# Patient Record
Sex: Male | Born: 1940
Health system: Southern US, Community
[De-identification: ages and names within clinical notes are randomized; demographics above are authoritative.]

## PROBLEM LIST (undated history)

## (undated) DIAGNOSIS — F32A Depression, unspecified: Secondary | ICD-10-CM

## (undated) DIAGNOSIS — M1990 Unspecified osteoarthritis, unspecified site: Secondary | ICD-10-CM

## (undated) DIAGNOSIS — N400 Enlarged prostate without lower urinary tract symptoms: Secondary | ICD-10-CM

## (undated) DIAGNOSIS — K649 Unspecified hemorrhoids: Secondary | ICD-10-CM

## (undated) DIAGNOSIS — E785 Hyperlipidemia, unspecified: Secondary | ICD-10-CM

## (undated) DIAGNOSIS — K219 Gastro-esophageal reflux disease without esophagitis: Secondary | ICD-10-CM

## (undated) DIAGNOSIS — F419 Anxiety disorder, unspecified: Secondary | ICD-10-CM

## (undated) DIAGNOSIS — I10 Essential (primary) hypertension: Secondary | ICD-10-CM

## (undated) DIAGNOSIS — G4730 Sleep apnea, unspecified: Secondary | ICD-10-CM

## (undated) DIAGNOSIS — F329 Major depressive disorder, single episode, unspecified: Secondary | ICD-10-CM

## (undated) HISTORY — DX: Benign prostatic hyperplasia without lower urinary tract symptoms: N40.0

## (undated) HISTORY — DX: Hyperlipidemia, unspecified: E78.5

## (undated) HISTORY — DX: Gastro-esophageal reflux disease without esophagitis: K21.9

## (undated) HISTORY — PX: SEPTOPLASTY: SUR1290

## (undated) HISTORY — DX: Depression, unspecified: F32.A

## (undated) HISTORY — PX: TONSILLECTOMY: SUR1361

## (undated) HISTORY — PX: OTHER SURGICAL HISTORY: SHX169

## (undated) HISTORY — PX: BACK SURGERY: SHX140

## (undated) HISTORY — PX: NASAL SINUS SURGERY: SHX719

## (undated) HISTORY — DX: Essential (primary) hypertension: I10

## (undated) HISTORY — PX: CHOLECYSTECTOMY: SHX55

## (undated) HISTORY — DX: Major depressive disorder, single episode, unspecified: F32.9

## (undated) HISTORY — DX: Anxiety disorder, unspecified: F41.9

---

## 2002-05-08 ENCOUNTER — Emergency Department (HOSPITAL_COMMUNITY): Admission: AC | Admit: 2002-05-08 | Discharge: 2002-05-08 | Payer: Self-pay

## 2002-05-08 ENCOUNTER — Encounter: Payer: Self-pay | Admitting: *Deleted

## 2004-09-08 ENCOUNTER — Ambulatory Visit: Payer: Self-pay | Admitting: Internal Medicine

## 2004-09-22 ENCOUNTER — Ambulatory Visit: Payer: Self-pay | Admitting: Internal Medicine

## 2004-12-04 ENCOUNTER — Ambulatory Visit: Payer: Self-pay | Admitting: Gastroenterology

## 2004-12-15 ENCOUNTER — Ambulatory Visit: Payer: Self-pay | Admitting: Gastroenterology

## 2005-02-15 ENCOUNTER — Ambulatory Visit: Payer: Self-pay | Admitting: Internal Medicine

## 2005-06-25 ENCOUNTER — Ambulatory Visit: Payer: Self-pay | Admitting: Internal Medicine

## 2005-09-01 ENCOUNTER — Ambulatory Visit: Payer: Self-pay | Admitting: Internal Medicine

## 2005-09-08 ENCOUNTER — Ambulatory Visit: Payer: Self-pay | Admitting: Internal Medicine

## 2005-12-09 ENCOUNTER — Ambulatory Visit: Payer: Self-pay | Admitting: Internal Medicine

## 2005-12-30 ENCOUNTER — Ambulatory Visit (HOSPITAL_BASED_OUTPATIENT_CLINIC_OR_DEPARTMENT_OTHER): Admission: RE | Admit: 2005-12-30 | Discharge: 2005-12-30 | Payer: Self-pay | Admitting: Internal Medicine

## 2006-01-18 ENCOUNTER — Ambulatory Visit: Payer: Self-pay | Admitting: Pulmonary Disease

## 2006-02-10 ENCOUNTER — Ambulatory Visit: Payer: Self-pay | Admitting: Emergency Medicine

## 2006-04-21 ENCOUNTER — Ambulatory Visit (HOSPITAL_COMMUNITY): Admission: RE | Admit: 2006-04-21 | Discharge: 2006-04-22 | Payer: Self-pay | Admitting: Otolaryngology

## 2006-08-25 ENCOUNTER — Ambulatory Visit: Payer: Self-pay | Admitting: Internal Medicine

## 2006-09-13 ENCOUNTER — Ambulatory Visit: Payer: Self-pay | Admitting: Internal Medicine

## 2006-09-13 LAB — CONVERTED CEMR LAB
ALT: 24 units/L (ref 0–40)
AST: 26 units/L (ref 0–37)
Albumin: 3.8 g/dL (ref 3.5–5.2)
Alkaline Phosphatase: 115 units/L (ref 39–117)
BUN: 12 mg/dL (ref 6–23)
Basophils Absolute: 0 10*3/uL (ref 0.0–0.1)
Basophils Relative: 0.2 % (ref 0.0–1.0)
Bilirubin Urine: NEGATIVE
CO2: 27 meq/L (ref 19–32)
Calcium: 9.3 mg/dL (ref 8.4–10.5)
Chloride: 105 meq/L (ref 96–112)
Chol/HDL Ratio, serum: 3.2
Cholesterol: 153 mg/dL (ref 0–200)
Creatinine, Ser: 1 mg/dL (ref 0.4–1.5)
Eosinophil percent: 1.7 % (ref 0.0–5.0)
GFR calc non Af Amer: 80 mL/min
Glomerular Filtration Rate, Af Am: 97 mL/min/{1.73_m2}
Glucose, Bld: 106 mg/dL — ABNORMAL HIGH (ref 70–99)
HCT: 42 % (ref 39.0–52.0)
HDL: 47.2 mg/dL (ref >39.0)
Hemoglobin, Urine: NEGATIVE
Hemoglobin: 14 g/dL (ref 13.0–17.0)
Ketones, ur: NEGATIVE mg/dL
LDL Cholesterol: 84 mg/dL (ref 0–99)
Leukocytes, UA: NEGATIVE
Lymphocytes Relative: 39.8 % (ref 12.0–46.0)
MCHC: 33.2 g/dL (ref 30.0–36.0)
MCV: 90.4 fL (ref 78.0–100.0)
Monocytes Absolute: 0.6 10*3/uL (ref 0.2–0.7)
Monocytes Relative: 10.2 % (ref 3.0–11.0)
Neutro Abs: 3 10*3/uL (ref 1.4–7.7)
Neutrophils Relative %: 48.1 % (ref 43.0–77.0)
Nitrite: NEGATIVE
PSA: 0.4 ng/mL (ref 0.10–4.00)
Platelets: 220 10*3/uL (ref 150–400)
Potassium: 4.5 meq/L (ref 3.5–5.1)
RBC: 4.65 M/uL (ref 4.22–5.81)
RDW: 15.2 % — ABNORMAL HIGH (ref 11.5–14.6)
Sodium: 140 meq/L (ref 135–145)
Specific Gravity, Urine: >=1.03 (ref 1.000–1.03)
TSH: 2.33 microintl units/mL (ref 0.35–5.50)
Total Bilirubin: 0.6 mg/dL (ref 0.3–1.2)
Total Protein, Urine: NEGATIVE mg/dL
Total Protein: 6.6 g/dL (ref 6.0–8.3)
Triglyceride fasting, serum: 108 mg/dL (ref 0–149)
Urine Glucose: NEGATIVE mg/dL
Urobilinogen, UA: 0.2 (ref 0.0–1.0)
VLDL: 22 mg/dL (ref 0–40)
WBC: 6.1 10*3/uL (ref 4.5–10.5)
pH: 5.5 (ref 5.0–8.0)

## 2006-09-21 ENCOUNTER — Ambulatory Visit: Payer: Self-pay | Admitting: Internal Medicine

## 2006-12-29 ENCOUNTER — Ambulatory Visit: Payer: Self-pay | Admitting: Internal Medicine

## 2007-04-08 ENCOUNTER — Ambulatory Visit: Payer: Self-pay | Admitting: Internal Medicine

## 2007-05-29 ENCOUNTER — Encounter: Payer: Self-pay | Admitting: Internal Medicine

## 2007-05-29 DIAGNOSIS — N401 Enlarged prostate with lower urinary tract symptoms: Secondary | ICD-10-CM | POA: Insufficient documentation

## 2007-05-29 DIAGNOSIS — K219 Gastro-esophageal reflux disease without esophagitis: Secondary | ICD-10-CM

## 2007-05-29 DIAGNOSIS — E785 Hyperlipidemia, unspecified: Secondary | ICD-10-CM

## 2007-05-29 DIAGNOSIS — F411 Generalized anxiety disorder: Secondary | ICD-10-CM | POA: Insufficient documentation

## 2007-05-29 DIAGNOSIS — I10 Essential (primary) hypertension: Secondary | ICD-10-CM

## 2007-05-29 DIAGNOSIS — R351 Nocturia: Secondary | ICD-10-CM

## 2007-05-29 DIAGNOSIS — F329 Major depressive disorder, single episode, unspecified: Secondary | ICD-10-CM

## 2007-05-29 DIAGNOSIS — F330 Major depressive disorder, recurrent, mild: Secondary | ICD-10-CM | POA: Insufficient documentation

## 2007-05-29 DIAGNOSIS — N400 Enlarged prostate without lower urinary tract symptoms: Secondary | ICD-10-CM | POA: Insufficient documentation

## 2007-07-13 ENCOUNTER — Encounter: Payer: Self-pay | Admitting: Internal Medicine

## 2007-07-13 ENCOUNTER — Ambulatory Visit: Payer: Self-pay | Admitting: Internal Medicine

## 2007-09-19 ENCOUNTER — Ambulatory Visit: Payer: Self-pay | Admitting: Internal Medicine

## 2007-09-19 LAB — CONVERTED CEMR LAB
ALT: 24 units/L (ref 0–53)
AST: 30 units/L (ref 0–37)
Albumin: 3.7 g/dL (ref 3.5–5.2)
Alkaline Phosphatase: 88 units/L (ref 39–117)
BUN: 11 mg/dL (ref 6–23)
Bilirubin, Direct: 0.1 mg/dL (ref 0.0–0.3)
CO2: 30 meq/L (ref 19–32)
Calcium: 9.3 mg/dL (ref 8.4–10.5)
Chloride: 102 meq/L (ref 96–112)
Cholesterol: 171 mg/dL (ref 0–200)
Creatinine, Ser: 0.8 mg/dL (ref 0.4–1.5)
Direct LDL: 96.4 mg/dL
GFR calc Af Amer: 125 mL/min
GFR calc non Af Amer: 103 mL/min
Glucose, Bld: 108 mg/dL — ABNORMAL HIGH (ref 70–99)
HDL: 45.3 mg/dL (ref >39.0)
PSA: 1.78 ng/mL (ref 0.10–4.00)
Potassium: 4.2 meq/L (ref 3.5–5.1)
Sodium: 138 meq/L (ref 135–145)
Total Bilirubin: 0.6 mg/dL (ref 0.3–1.2)
Total CHOL/HDL Ratio: 3.8
Total Protein: 6.6 g/dL (ref 6.0–8.3)
Triglycerides: 279 mg/dL (ref 0–149)
VLDL: 56 mg/dL — ABNORMAL HIGH (ref 0–40)

## 2007-09-22 ENCOUNTER — Encounter: Payer: Self-pay | Admitting: Internal Medicine

## 2007-09-25 ENCOUNTER — Ambulatory Visit: Payer: Self-pay | Admitting: Internal Medicine

## 2007-09-25 DIAGNOSIS — F528 Other sexual dysfunction not due to a substance or known physiological condition: Secondary | ICD-10-CM | POA: Insufficient documentation

## 2007-10-03 ENCOUNTER — Telehealth: Payer: Self-pay | Admitting: Internal Medicine

## 2007-10-27 ENCOUNTER — Telehealth: Payer: Self-pay | Admitting: Internal Medicine

## 2007-11-02 ENCOUNTER — Telehealth: Payer: Self-pay | Admitting: Internal Medicine

## 2007-11-08 ENCOUNTER — Encounter: Payer: Self-pay | Admitting: Internal Medicine

## 2007-12-05 ENCOUNTER — Ambulatory Visit: Payer: Self-pay | Admitting: Internal Medicine

## 2007-12-18 ENCOUNTER — Telehealth: Payer: Self-pay | Admitting: Internal Medicine

## 2008-01-09 ENCOUNTER — Encounter: Payer: Self-pay | Admitting: Internal Medicine

## 2008-04-08 ENCOUNTER — Telehealth: Payer: Self-pay | Admitting: Internal Medicine

## 2008-04-11 ENCOUNTER — Ambulatory Visit: Payer: Self-pay | Admitting: Internal Medicine

## 2008-04-11 DIAGNOSIS — F070 Personality change due to known physiological condition: Secondary | ICD-10-CM | POA: Insufficient documentation

## 2008-04-11 LAB — CONVERTED CEMR LAB
BUN: 14 mg/dL (ref 6–23)
CO2: 26 meq/L (ref 19–32)
Calcium: 9.3 mg/dL (ref 8.4–10.5)
Chloride: 106 meq/L (ref 96–112)
Creatinine, Ser: 1.1 mg/dL (ref 0.4–1.5)
Folate: >20 ng/mL
GFR calc Af Amer: 87 mL/min
GFR calc non Af Amer: 72 mL/min
Glucose, Bld: 140 mg/dL — ABNORMAL HIGH (ref 70–99)
Potassium: 3.7 meq/L (ref 3.5–5.1)
Sodium: 140 meq/L (ref 135–145)
TSH: 1.61 microintl units/mL (ref 0.35–5.50)
Vitamin B-12: 453 pg/mL (ref 211–911)

## 2008-04-14 ENCOUNTER — Encounter: Payer: Self-pay | Admitting: Internal Medicine

## 2008-04-16 ENCOUNTER — Telehealth: Payer: Self-pay | Admitting: Internal Medicine

## 2008-05-03 ENCOUNTER — Telehealth: Payer: Self-pay | Admitting: Internal Medicine

## 2008-05-03 ENCOUNTER — Ambulatory Visit: Payer: Self-pay | Admitting: Internal Medicine

## 2008-05-03 DIAGNOSIS — M542 Cervicalgia: Secondary | ICD-10-CM | POA: Insufficient documentation

## 2008-05-06 ENCOUNTER — Encounter: Admission: RE | Admit: 2008-05-06 | Discharge: 2008-05-06 | Payer: Self-pay | Admitting: Internal Medicine

## 2008-05-10 ENCOUNTER — Telehealth: Payer: Self-pay | Admitting: Internal Medicine

## 2008-05-17 ENCOUNTER — Telehealth: Payer: Self-pay | Admitting: Internal Medicine

## 2008-06-25 ENCOUNTER — Ambulatory Visit: Payer: Self-pay | Admitting: Internal Medicine

## 2008-08-27 ENCOUNTER — Ambulatory Visit: Payer: Self-pay | Admitting: Internal Medicine

## 2008-08-27 LAB — CONVERTED CEMR LAB
Albumin: 4 g/dL (ref 3.5–5.2)
Alkaline Phosphatase: 75 units/L (ref 39–117)
BUN: 18 mg/dL (ref 6–23)
Calcium: 9.2 mg/dL (ref 8.4–10.5)
Cholesterol: 160 mg/dL (ref 0–200)
Creatinine, Ser: 0.9 mg/dL (ref 0.4–1.5)
GFR calc Af Amer: 109 mL/min
GFR calc non Af Amer: 90 mL/min
Glucose, Bld: 102 mg/dL — ABNORMAL HIGH (ref 70–99)
HDL: 55.6 mg/dL (ref >39.0)
LDL Cholesterol: 73 mg/dL (ref 0–99)
PSA: 0.52 ng/mL (ref 0.10–4.00)
Potassium: 4.4 meq/L (ref 3.5–5.1)
Total Protein: 6.9 g/dL (ref 6.0–8.3)
Triglycerides: 156 mg/dL — ABNORMAL HIGH (ref 0–149)
VLDL: 31 mg/dL (ref 0–40)

## 2008-08-29 ENCOUNTER — Encounter: Payer: Self-pay | Admitting: Internal Medicine

## 2008-09-12 ENCOUNTER — Telehealth: Payer: Self-pay | Admitting: Internal Medicine

## 2008-09-13 ENCOUNTER — Ambulatory Visit: Payer: Self-pay | Admitting: Internal Medicine

## 2008-09-13 ENCOUNTER — Encounter: Payer: Self-pay | Admitting: Internal Medicine

## 2008-09-27 ENCOUNTER — Telehealth: Payer: Self-pay | Admitting: Internal Medicine

## 2008-12-03 ENCOUNTER — Telehealth: Payer: Self-pay | Admitting: Internal Medicine

## 2008-12-27 ENCOUNTER — Telehealth: Payer: Self-pay | Admitting: Internal Medicine

## 2009-03-26 ENCOUNTER — Encounter: Payer: Self-pay | Admitting: Internal Medicine

## 2009-08-05 ENCOUNTER — Telehealth: Payer: Self-pay | Admitting: Internal Medicine

## 2009-09-11 ENCOUNTER — Ambulatory Visit: Payer: Self-pay | Admitting: Internal Medicine

## 2009-09-11 LAB — CONVERTED CEMR LAB
ALT: 26 units/L (ref 0–53)
Albumin: 4.1 g/dL (ref 3.5–5.2)
Alkaline Phosphatase: 95 units/L (ref 39–117)
BUN: 10 mg/dL (ref 6–23)
Basophils Absolute: 0 10*3/uL (ref 0.0–0.1)
Bilirubin, Direct: 0.1 mg/dL (ref 0.0–0.3)
Cholesterol: 110 mg/dL (ref 0–200)
Creatinine, Ser: 0.8 mg/dL (ref 0.4–1.5)
Eosinophils Absolute: 0.1 10*3/uL (ref 0.0–0.7)
GFR calc non Af Amer: 102.78 mL/min (ref >60)
Lymphocytes Relative: 47.6 % — ABNORMAL HIGH (ref 12.0–46.0)
MCHC: 33.1 g/dL (ref 30.0–36.0)
Monocytes Relative: 8.8 % (ref 3.0–12.0)
Neutrophils Relative %: 40.7 % — ABNORMAL LOW (ref 43.0–77.0)
PSA: 0.43 ng/mL (ref 0.10–4.00)
Potassium: 4.6 meq/L (ref 3.5–5.1)
RDW: 13.1 % (ref 11.5–14.6)
Total Protein: 7 g/dL (ref 6.0–8.3)
Triglycerides: 90 mg/dL (ref 0.0–149.0)
VLDL: 18 mg/dL (ref 0.0–40.0)

## 2009-10-14 ENCOUNTER — Ambulatory Visit: Payer: Self-pay | Admitting: Internal Medicine

## 2009-10-14 DIAGNOSIS — J069 Acute upper respiratory infection, unspecified: Secondary | ICD-10-CM | POA: Insufficient documentation

## 2009-12-17 ENCOUNTER — Encounter (INDEPENDENT_AMBULATORY_CARE_PROVIDER_SITE_OTHER): Payer: Self-pay | Admitting: *Deleted

## 2010-01-07 ENCOUNTER — Telehealth: Payer: Self-pay | Admitting: Internal Medicine

## 2010-04-21 ENCOUNTER — Telehealth: Payer: Self-pay | Admitting: Internal Medicine

## 2010-05-25 ENCOUNTER — Telehealth: Payer: Self-pay | Admitting: Internal Medicine

## 2010-05-28 ENCOUNTER — Encounter: Payer: Self-pay | Admitting: Internal Medicine

## 2010-05-31 ENCOUNTER — Encounter: Payer: Self-pay | Admitting: Internal Medicine

## 2010-06-17 ENCOUNTER — Telehealth: Payer: Self-pay | Admitting: Internal Medicine

## 2010-06-18 ENCOUNTER — Ambulatory Visit: Payer: Self-pay | Admitting: Internal Medicine

## 2010-06-18 DIAGNOSIS — H8110 Benign paroxysmal vertigo, unspecified ear: Secondary | ICD-10-CM

## 2010-07-03 ENCOUNTER — Ambulatory Visit: Payer: Self-pay | Admitting: Internal Medicine

## 2010-07-16 ENCOUNTER — Telehealth: Payer: Self-pay | Admitting: Gastroenterology

## 2010-07-20 ENCOUNTER — Encounter (INDEPENDENT_AMBULATORY_CARE_PROVIDER_SITE_OTHER): Payer: Self-pay | Admitting: *Deleted

## 2010-08-07 ENCOUNTER — Encounter (INDEPENDENT_AMBULATORY_CARE_PROVIDER_SITE_OTHER): Payer: Self-pay | Admitting: *Deleted

## 2010-08-11 ENCOUNTER — Ambulatory Visit: Payer: Self-pay | Admitting: Gastroenterology

## 2010-08-11 ENCOUNTER — Telehealth: Payer: Self-pay | Admitting: Internal Medicine

## 2010-08-26 ENCOUNTER — Ambulatory Visit: Payer: Self-pay | Admitting: Gastroenterology

## 2010-08-26 LAB — HM COLONOSCOPY: HM Colonoscopy: NORMAL

## 2010-09-14 ENCOUNTER — Telehealth: Payer: Self-pay | Admitting: Internal Medicine

## 2010-09-15 ENCOUNTER — Ambulatory Visit: Payer: Self-pay | Admitting: Internal Medicine

## 2010-09-15 ENCOUNTER — Encounter: Payer: Self-pay | Admitting: Internal Medicine

## 2010-09-15 LAB — CONVERTED CEMR LAB
ALT: 21 units/L (ref 0–53)
Albumin: 4.1 g/dL (ref 3.5–5.2)
Basophils Relative: 0.4 % (ref 0.0–3.0)
Bilirubin, Direct: 0.1 mg/dL (ref 0.0–0.3)
Cholesterol: 152 mg/dL (ref 0–200)
Eosinophils Absolute: 0.1 10*3/uL (ref 0.0–0.7)
GFR calc non Af Amer: 93.01 mL/min (ref >60.00)
HDL: 52.1 mg/dL (ref >39.00)
Lymphocytes Relative: 38.5 % (ref 12.0–46.0)
MCHC: 34.4 g/dL (ref 30.0–36.0)
Neutrophils Relative %: 50.4 % (ref 43.0–77.0)
Potassium: 4.4 meq/L (ref 3.5–5.1)
RBC: 4.38 M/uL (ref 4.22–5.81)
Sodium: 139 meq/L (ref 135–145)
Total Protein: 7 g/dL (ref 6.0–8.3)
Triglycerides: 87 mg/dL (ref 0.0–149.0)
VLDL: 17.4 mg/dL (ref 0.0–40.0)
WBC: 8.5 10*3/uL (ref 4.5–10.5)

## 2010-10-08 ENCOUNTER — Telehealth: Payer: Self-pay | Admitting: Internal Medicine

## 2010-10-26 ENCOUNTER — Telehealth: Payer: Self-pay | Admitting: Internal Medicine

## 2010-11-10 NOTE — Progress Notes (Signed)
Summary: CREAM  Phone Note Call from Patient   Caller: Patient Summary of Call: REQUEST MED PROCTOSOL(CREAM) CALLED IN [facility] [address] Initial call taken by: Migdalia Dk,  August 11, 2010 2:48 PM  Follow-up for Phone Call        Patient notified.Alvy Beal Archie CMA  August 11, 2010 4:03 PM     Prescriptions: PROCTOSOL HC 2.5 %  CREA (HYDROCORTISONE) as needed  #45g x 3   Entered by:   Rock Nephew CMA   Authorized by:   Jacques Navy MD   Signed by:   Rock Nephew CMA on 08/11/2010   Method used:   Electronically to        [facility]. [phone]* (retail)       [address]       [place], [place]  [address]       Ph: [phone]       Fax: [phone]   RxID:   [id number]

## 2010-11-10 NOTE — Letter (Signed)
Summary: Pre Visit Letter Revised  [facility]  [address] [place], [place] [address]   Phone: [phone]  Fax: [phone]        07/20/2010 MRN: [id number] [facility] [address] [place], [place]  [address]             Procedure Date:  08/26/2010   Welcome to the Gastroenterology Division at [facility].    You are scheduled to see a nurse for your pre-procedure visit on 08/11/2010 at 2:00PM on the 3rd floor at [facility], [address]  We ask that you try to arrive at our office 15 minutes prior to your appointment time to allow for check-in.  Please take a minute to review the attached form.  If you answer "Yes" to one or more of the questions on the first page, we ask that you call the person listed at your earliest opportunity.  If you answer "No" to all of the questions, please complete the rest of the form and bring it to your appointment.    Your nurse visit will consist of discussing your medical and surgical history, your immediate family medical history, and your medications.   If you are unable to list all of your medications on the form, please bring the medication bottles to your appointment and we will list them.  We will need to be aware of both prescribed and over the counter drugs.  We will need to know exact dosage information as well.    Please be prepared to read and sign documents such as consent forms, a financial agreement, and acknowledgement forms.  If necessary, and with your consent, a friend or relative is welcome to sit-in on the nurse visit with you.  Please bring your insurance card so that we may make a copy of it.  If your insurance requires a referral to see a specialist, please bring your referral form from your primary care physician.  No co-pay is required for this nurse visit.     If you cannot keep your appointment, please call [phone] to cancel or reschedule prior to your appointment date.  This  allows [place] the opportunity to schedule an appointment for another patient in need of care.    Thank you for choosing [facility] for your medical needs.  We appreciate the opportunity to care for you.  Please visit [place] at our website  to learn more about our practice.  Sincerely, The Gastroenterology Division

## 2010-11-10 NOTE — Assessment & Plan Note (Signed)
Summary: FLU VAC MEN  STC  Nurse Visit   Allergies: No Known Drug Allergies  Orders Added: 1)  Flu Vaccine 3yrs + MEDICARE PATIENTS [Q2039] 2)  Administration Flu vaccine - MCR [G0008]      Flu Vaccine Consent Questions     Do you have a history of severe allergic reactions to this vaccine? no    Any prior history of allergic reactions to egg and/or gelatin? no    Do you have a sensitivity to the preservative Thimersol? no    Do you have a past history of Guillan-Barre Syndrome? no    Do you currently have an acute febrile illness? no    Have you ever had a severe reaction to latex? no    Vaccine information given and explained to patient? yes    Are you currently pregnant? no    Lot Number:[id number]   Exp Date:04/10/2011   Site Given  Left Deltoid IMu  

## 2010-11-10 NOTE — Progress Notes (Signed)
Summary: REFILL   Phone Note Refill Request Call back at Home Phone [phone]   Refills Requested: Medication #1:  ALPRAZOLAM 0.5 MG  TABS 1 every 6 hrs as needed for agitation  Medication #2:  FINASTERIDE 5 MG TABS 1 by mouth once daily Patient is requesting written rx to pick up.   Initial call taken by: Lamar Sprinkles, CMA,  April 21, 2010 1:05 PM  Follow-up for Phone Call        ok for refill: alprazolam x 5,  fenisteride as needed. Follow-up by: Jacques Navy MD,  April 22, 2010 8:41 AM  Additional Follow-up for Phone Call Additional follow up Details #1::        waiting on md to sign Additional Follow-up by: Ami Bullins CMA,  April 22, 2010 11:31 AM    Additional Follow-up for Phone Call Additional follow up Details #2::    left vm for pt, order is ready, upfront Follow-up by: Lamar Sprinkles, CMA,  April 22, 2010 5:50 PM  Prescriptions: FINASTERIDE 5 MG TABS (FINASTERIDE) 1 by mouth once daily  #90 x 1   Entered by:   Ami Bullins CMA   Authorized by:   Jacques Navy MD   Signed by:   Bill Salinas CMA on 04/22/2010   Method used:   Print then Give to Patient   RxID:   [id number] ALPRAZOLAM 0.5 MG  TABS (ALPRAZOLAM) 1 every 6 hrs as needed for agitation,  anger.  #180 x 5   Entered by:   Bill Salinas CMA   Authorized by:   Jacques Navy MD   Signed by:   Bill Salinas CMA on 04/22/2010   Method used:   Print then Give to Patient   RxID:   [id number]

## 2010-11-10 NOTE — Progress Notes (Signed)
Summary: Schedule Colonoscopy  Phone Note Outgoing Call Call back at [facility] Phone [phone]   Call placed by: Harlow Mares CMA Duncan Dull),  July 16, 2010 4:51 PM Call placed to: Patient Summary of Call: Left a message on patients machine to call back. patient is due for a colonoscopy his chart was reviewed by Dr. Christella Hartigan  but since he has not seen any GI in our office since Dr. Corinda Gubler has retired so when he schedules he can be put on any the MD of his choice.  Initial call taken by: Harlow Mares CMA Duncan Dull),  July 16, 2010 4:53 PM  Follow-up for Phone Call        colon  11/16 with jacobs Follow-up by: Harlow Mares CMA [facility]),  July 24, 2010 11:41 AM

## 2010-11-10 NOTE — Letter (Signed)
Summary: Colonoscopy Letter  [facility]  [address] [place], [place] [address]   Phone: [phone]  Fax: [phone]      December 17, 2009 MRN: [id number]   [facility] [address] [place], [place]  [address]   Dear [name],   According to your medical record, it is time for you to schedule a Colonoscopy. The American Cancer Society recommends this procedure as a method to detect early colon cancer. Patients with a family history of colon cancer, or a personal history of colon polyps or inflammatory bowel disease are at increased risk.  This letter has been generated based on the recommendations made at the time of your procedure. If you feel that in your particular situation this may no longer apply, please contact our office.  Please call our office at [phone] to schedule this appointment or to update your records at your earliest convenience.  Thank you for cooperating with [place] to provide you with the very best care possible.   Sincerely,  Rachael Fee, M.D.  [facility] Gastroenterology Division [phone]

## 2010-11-10 NOTE — Procedures (Signed)
Summary: Colonoscopy  Patient: [name] Note: All result statuses are Final unless otherwise noted.  Tests: (1) Colonoscopy (COL)   COL Colonoscopy           DONE     [facility]     [address]     [place], [place]  [address]           COLONOSCOPY PROCEDURE REPORT           PATIENT:  [name], [name]  MR#:  [id number]     BIRTHDATE:  Apr 12, 1941, 69 yrs. old  GENDER:  male     ENDOSCOPIST:  Rachael Fee, MD     PROCEDURE DATE:  08/26/2010     PROCEDURE:  Diagnostic Colonoscopy     ASA CLASS:  Class II     INDICATIONS:  history of pre-cancerous (adenomatous) colon polyps     [facility])     MEDICATIONS:   Fentanyl 75 mcg IV, Versed 9 mg IV           DESCRIPTION OF PROCEDURE:   After the risks benefits and     alternatives of the procedure were thoroughly explained, informed     consent was obtained.  Digital rectal exam was performed and     revealed no rectal masses.   The LB PCF-H180AL [id number] and LB     180AL [id number] endoscope was introduced through the anus and     advanced to the cecum, which was identified by both the appendix     and ileocecal valve, without limitations.  The quality of the prep     was adequate, using MoviPrep.  The instrument was then slowly     withdrawn as the colon was fully examined.     <<PROCEDUREIMAGES>>     FINDINGS:  Mild diverticulosis was found in the sigmoid to     descending colon segments (see image1).  This was otherwise a     normal examination of the colon (see image6, image4, and image8).     Retroflexed views in the rectum revealed no abnormalities.    The     scope was then withdrawn from the patient and the procedure     completed.     COMPLICATIONS:  None           ENDOSCOPIC IMPRESSION:     1) Mild diverticulosis in the sigmoid to descending colon     segments     2) Otherwise normal examination; no polyps or cancers           RECOMMENDATIONS:     1) Given your personal history of adenomatous (pre-cancerous)  polyps, you will need a repeat colonoscopy in 5 years.           REPEAT EXAM:  5 years           ______________________________     Rachael Fee, MD           n.     eSIGNED:   Rachael Fee at 08/26/2010 02:56 PM           Neil Crouch, [id number]  Note: An exclamation mark (!) indicates a result that was not dispersed into the flowsheet. Document Creation Date: 08/26/2010 2:56 PM _______________________________________________________________________  (1) Order result status: Final Collection or observation date-time: 08/26/2010 14:53 Requested date-time:  Receipt date-time:  Reported date-time:  Referring Physician:   Ordering Physician: Rob Bunting [phone]) Specimen Source:  Source: Launa Grill Order Number: [id number] Lab  site:   Appended Document: Colonoscopy    Clinical Lists Changes  Observations: Added new observation of COLONNXTDUE: 08/2015 (08/26/2010 15:36)

## 2010-11-10 NOTE — Assessment & Plan Note (Signed)
Summary: Yearly f/u   Vital Signs:  Patient profile:   70 year old male Height:      68 inches Weight:      268 pounds BMI:     40.90 O2 Sat:      95 % on Room air Temp:     98.4 degrees F oral Pulse rate:   65 / minute BP sitting:   118 / 66  (left arm) Cuff size:   large  Vitals Entered By: Bill Salinas CMA (September 15, 2010 1:31 PM)  O2 Flow:  Room air CC: yearly/ ab  Vision Screening:      Vision Comments: eye exam March 2011   Primary Care Provider:  [facility]  CC:  yearly/ ab.  History of Present Illness: Patient presents for a wellness/prevention exam. In the nterval since his last visit he has been doing well: no serious medical illness, no surgery and no injury. He has been taking all his medications without problems. He has remained active.  Patient has a h/o depression but is currently doing well and denies any active depression. He is independent in all his activities of daily living. He has not had any falls and his fall risk is minor, related to his weight only with no limitations on his activities. 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 He is cognitively intact in all sphere with good recall and interactive skills.  Preventive Screening-Counseling & Management  Alcohol-Tobacco     Alcohol drinks/day: 0     Smoking Status: quit  Caffeine-Diet-Exercise     Caffeine use/day: 2 cups per day     Does Patient Exercise: yes     Type of exercise: yard work daily     Times/week: 7  Hep-HIV-STD-Contraception     Hepatitis Risk: no risk noted     HIV Risk: no risk noted     STD Risk: no risk noted     Dental Visit-last 6 months no     Sun Exposure-Excessive: no  Safety-Violence-Falls     Seat Belt Use: yes     Helmet Use: n/a     Firearms in the Home: no firearms in the home     Smoke Detectors: yes     Violence in the Home: no risk noted     Sexual  Abuse: no     Fall Risk: fall risk      Sexual History:  currently monogamous.        Drug Use:  never.        Blood Transfusions:  no.    Current Medications (verified): 1)  Doxazosin Mesylate 4 Mg Tabs (Doxazosin Mesylate) .Marland Kitchen.. 1 By Mouth At Bedtime 2)  Aspirin 81mg  .... Once Daily 3)  Garlic 4)  Multivitamins   Tabs (Multiple Vitamin) .... Take 1 Tablet By Mouth Once A Day 5)  B-Complex 6)  Vitamin C 7)  Proctosol Hc 2.5 %  Crea (Hydrocortisone) .... As Needed 8)  Clotrimazole-Betamethasone 1-0.05 %  Lotn (Clotrimazole-Betamethasone) .... As Needed 9)  Crestor 40 Mg  Tabs (Rosuvastatin Calcium) .... Take 1 Tablet By Mouth Once A Day 10)  Amlodipine Besylate 5 Mg  Tabs (Amlodipine Besylate) .Marland Kitchen.. 1 Once Daily 11)  Accupril 20 Mg  Tabs (Quinapril Hcl) .... 1/2 Once Daily 12)  Finasteride 5 Mg Tabs (Finasteride) .Marland Kitchen.. 1 By Mouth Once Daily 13)  Alprazolam  0.5 Mg  Tabs (Alprazolam) .Marland Kitchen.. 1 Every 6 Hrs As Needed For Agitation,  Anger. 14)  Effexor Xr 150 Mg Xr24h-Cap (Venlafaxine Hcl) .Marland Kitchen.. 1 By Mouth Q Am 15)  Mucus Relief Dm 20-400 Mg Tabs (Dextromethorphan-Guaifenesin) .... Take 1 Tablet By Mouth Every 4-6 Hours As Needed  Allergies (verified): No Known Drug Allergies  Past History:  Past Medical History: Last updated: 05/29/2007 Anxiety Depression GERD Hyperlipidemia Hypertension Benign prostatic hypertrophy  Past Surgical History: Last updated: 09/11/2009 Cholecystectomy Sinus surgery Septoplasty Tonsillectomy Disc Surgery teeth extracted - '08 Cataract with IOL OD Catarct with IOL OS-complications requiring laser therapy and drops  Family History: Last updated: 10-12-07 father-died Mar 08, 2058, lung cancer with mets mother- died 69, brain tumor, HTN, lipids, OA 2 brothers - one died unkown causes; gout Neg- colon, prostate cancer; DM; CAD  Social History: Last updated: 09/11/2009 [employer]-BA; [employer]- MDiv married 1965/03/08 3 boys- '68, '73, 08-Mar-1973 3  grandchildren Disability; 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 artificial feeding or hydration in the long term.   Social History: Caffeine use/day:  2 cups per day Dental Care w/in 6 mos.:  no 03/09/2023 Exposure-Excessive:  no Seat Belt Use:  yes Fall Risk:  fall risk Hepatitis Risk:  no risk noted HIV Risk:  no risk noted STD Risk:  no risk noted Sexual History:  currently monogamous Drug Use:  never Blood Transfusions:  no  Review of Systems  The patient denies anorexia, fever, weight loss, weight gain, decreased hearing, chest pain, syncope, dyspnea on exertion, prolonged cough, headaches, abdominal pain, severe indigestion/heartburn, incontinence, muscle weakness, difficulty walking, unusual weight change, enlarged lymph nodes, and angioedema.    Physical Exam  General:  obese white male in no distress Head:  Normocephalic and atraumatic without obvious abnormalities. No apparent alopecia or balding. Eyes:  No corneal or conjunctival inflammation noted. EOMI. Perrla. Funduscopic exam benign, without hemorrhages, exudates or papilledema. Vision grossly normal. Ears:  R ear normal and L ear normal.   Nose:  no external deformity and no external erythema.   Mouth:  eduntulous with full dentures. No buccal lesions, no posterior phayrnx lesions Neck:  supple, full ROM, no thyromegaly, and no carotid bruits.   Chest Wall:  no deformities and no tenderness.   Breasts:  gynecomastia.   Lungs:  normal respiratory effort, normal breath sounds, no crackles, and no wheezes.   Heart:  normal rate, regular rhythm, no murmur, no JVD, and no HJR.   Abdomen:  obese, mid-line surgical scar, soft, non-tender, and normal bowel sounds.   Msk:  normal ROM, no joint swelling, no redness over joints, and no joint deformities.  Tenderness at  Pulses:  2+ radial  and DP pulses Extremities:  No clubbing, cyanosis, edema, or deformity noted with normal full range of motion of all joints.   Neurologic:  alert & oriented X3, cranial nerves II-XII intact, gait normal, and DTRs symmetrical and normal.  Decreased sensation to light touch, pin prick and dense deficit in deep vibratory sensation both feet.  Skin:  turgor normal, no rashes, no ecchymoses, no ulcerations, and no edema.   Cervical Nodes:  no anterior cervical adenopathy and no posterior cervical adenopathy.   Psych:  Oriented X3, memory intact for recent and remote, normally interactive, and good eye contact.     Impression & Recommendations:  Problem # 1:  BENIGN POSITIONAL VERTIGO (  WRU-045.40) resolved with no active complaints at this time.  Problem # 2:  BENIGN PROSTATIC HYPERTROPHY (ICD-600.00) Doing well on current medications with control of nocturia and without daytime urgency or incontinence.  His updated medication list for this problem includes:    Doxazosin Mesylate 4 Mg Tabs (Doxazosin mesylate) .Marland Kitchen... 1 by mouth at bedtime    Finasteride 5 Mg Tabs (Finasteride) .Marland Kitchen... 1 by mouth once daily  Problem # 3:  HYPERTENSION (ICD-401.9)  His updated medication list for this problem includes:    Doxazosin Mesylate 4 Mg Tabs (Doxazosin mesylate) .Marland Kitchen... 1 by mouth at bedtime    Amlodipine Besylate 5 Mg Tabs (Amlodipine besylate) .Marland Kitchen... 1 once daily    Accupril 20 Mg Tabs (Quinapril hcl) .Marland Kitchen... 1/2 once daily  Orders: TLB-BMP (Basic Metabolic Panel-BMET) (80048-METABOL)  BP today: 118/66 Prior BP: 110/68 (06/18/2010)  Good control on present medications - will continue the same. Refills provided  Problem # 4:  HYPERLIPIDEMIA (ICD-272.4) Due for lab with recommendations to follow.  His updated medication list for this problem includes:    Crestor 40 Mg Tabs (Rosuvastatin calcium) .Marland Kitchen... Take 1 tablet by mouth once a day  Orders: TLB-Lipid Panel (80061-LIPID) TLB-Hepatic/Liver  Function Pnl (80076-HEPATIC)  Addendum: LDL 83, HDL 52 - excellent control with normal liver functions.  Plan - continue present medications.  Problem # 5:  GERD (ICD-530.81) Controlled on present medications.  The following medications were removed from the medication list:    Prilosec Otc 20 Mg Tbec (Omeprazole magnesium) .Marland Kitchen... Take 2 tablet by mouth once a day  Orders: TLB-CBC Platelet - w/Differential (85025-CBCD)  Problem # 6:  DEPRESSION (ICD-311) Doing well on present medications. No report of any personality issues and he is doing well at home.  Plan - continue present medications.  The following medications were removed from the medication list:    Effexor Xr 75 Mg Cp24 (Venlafaxine hcl) .Marland Kitchen... Take 1 tab by mouth at bedtime His updated medication list for this problem includes:    Alprazolam 0.5 Mg Tabs (Alprazolam) .Marland Kitchen... 1 every 6 hrs as needed for agitation,  anger.    Effexor Xr 150 Mg Xr24h-cap (Venlafaxine hcl) .Marland Kitchen... 1 by mouth q am  Problem # 7:  Preventive Health Care (ICD-V70.0)  Unremarkable interval history. Physical exam remarkable for obesity. Lab results are within normal limits. He is current with colorectal cancer screening with last colonoscopy in Nov '11. Immunizations: tetnus today; flu Sept '11; pnemonia vaccine Dec '08. He is a candidate for shingles vaccine when possible. 12 Lead EKG negative for any ischemic changes or old injury.  In summary - a very nice man who is medically stable. He is counseled and advised to work on weight loss: smart food choices, PORTION SIZE CONTROL - limit empty calories, avoid sugared beverages, regular aerobic exercise such as walking to a heart rate of 110-120 for 30 minutes 3 times a week. Target weight 200lbs (68 lb loss), goal is to loose 1-2 lbs per month ( 3-5 year project!!!). He is counseled to continue all his present medications; to determine his insurance coverage for shingles vaccine and to obtain the same.   He  will return in 6 months or as needed.   Orders: Medicare -1st Annual Wellness Visit [phone])  Complete Medication List: 1)  Doxazosin Mesylate 4 Mg Tabs (Doxazosin mesylate) .Marland Kitchen.. 1 by mouth at bedtime 2)  Aspirin 81mg   .... Once daily 3)  Garlic  4)  Multivitamins Tabs (Multiple vitamin) .... Take 1  tablet by mouth once a day 5)  B-complex  6)  Vitamin C  7)  Proctosol Hc 2.5 % Crea (Hydrocortisone) .... As needed 8)  Clotrimazole-betamethasone 1-0.05 % Lotn (Clotrimazole-betamethasone) .... As needed 9)  Crestor 40 Mg Tabs (Rosuvastatin calcium) .... Take 1 tablet by mouth once a day 10)  Amlodipine Besylate 5 Mg Tabs (Amlodipine besylate) .Marland Kitchen.. 1 once daily 11)  Accupril 20 Mg Tabs (Quinapril hcl) .... 1/2 once daily 12)  Finasteride 5 Mg Tabs (Finasteride) .Marland Kitchen.. 1 by mouth once daily 13)  Alprazolam 0.5 Mg Tabs (Alprazolam) .Marland Kitchen.. 1 every 6 hrs as needed for agitation,  anger. 14)  Effexor Xr 150 Mg Xr24h-cap (Venlafaxine hcl) .Marland Kitchen.. 1 by mouth q am 15)  Mucus Relief Dm 20-400 Mg Tabs (Dextromethorphan-guaifenesin) .... Take 1 tablet by mouth every 4-6 hours as needed  Other Orders: Tdap => 78yrs IM (04540) Admin 1st Vaccine (98119)  Patient: [name] Note: All result statuses are Final unless otherwise noted.  Tests: (1) BMP (METABOL)   Sodium                    139 mEq/L                   135-145   Potassium                 4.4 mEq/L                   3.5-5.1   Chloride                  102 mEq/L                   96-112   Carbon Dioxide            28 mEq/L                    19-32   Glucose              [H]  100 mg/dL                   14-78   BUN                       15 mg/dL                    2-95   Creatinine                0.9 mg/dL                   6.2-1.3   Calcium                   9.6 mg/dL                   0.8-65.7   GFR                       93.01 mL/min                >60.00  Tests: (2) Lipid Panel (LIPID)   Cholesterol               152 mg/dL                    8-469     ATP III Classification  Desirable:  < 200 mg/dL                    Borderline High:  200 - 239 mg/dL               High:  > = 240 mg/dL   Triglycerides             87.0 mg/dL                  1.6-109.6     Normal:  <150 mg/dL     Borderline High:  045 - 199 mg/dL   HDL                       40.98 mg/dL                 >11.91   VLDL Cholesterol          17.4 mg/dL                  4.7-82.9   LDL Cholesterol           83 mg/dL                    5-62  CHO/HDL Ratio:  CHD Risk                             3                    Men          Women     1/2 Average Risk     3.4          3.3     Average Risk          5.0          4.4     2X Average Risk          9.6          7.1     3X Average Risk          15.0          11.0                           Tests: (3) Hepatic/Liver Function Panel (HEPATIC)   Total Bilirubin           0.7 mg/dL                   1.3-0.8   Direct Bilirubin          0.1 mg/dL                   6.5-7.8   Alkaline Phosphatase      83 U/L                      39-117   AST                       26 U/L                      0-37   ALT                       21 U/L  0-53   Total Protein             7.0 g/dL                    3.0-8.6   Albumin                   4.1 g/dL                    5.7-8.4  Tests: (4) CBC Platelet w/Diff (CBCD)   White Cell Count          8.5 K/uL                    4.5-10.5   Red Cell Count            4.38 Mil/uL                 4.22-5.81   Hemoglobin                14.1 g/dL                   69.6-29.5   Hematocrit                40.9 %                      39.0-52.0   MCV                       93.5 fl                     78.0-100.0   MCHC                      34.4 g/dL                   28.4-13.2   RDW                       13.8 %                      11.5-14.6   Platelet Count            181.0 K/uL                  150.0-400.0   Neutrophil %              50.4 %                      43.0-77.0    Lymphocyte %              38.5 %                      12.0-46.0   Monocyte %                8.9 %                       3.0-12.0   Eosinophils%              1.8 %                       0.0-5.0   Basophils %  0.4 %                       0.0-3.0   Neutrophill Absolute      4.3 K/uL                    1.4-7.7   Lymphocyte Absolute       3.3 K/uL                    0.7-4.0   Monocyte Absolute         0.8 K/uL                    0.1-1.0  Eosinophils, Absolute                             0.1 K/uL                    0.0-0.7   Basophils Absolute        0.0 K/uL                    0.0-0.1Prescriptions: ALPRAZOLAM 0.5 MG  TABS (ALPRAZOLAM) 1 every 6 hrs as needed for agitation,  anger.  #180 x 5   Entered and Authorized by:   Jacques Navy MD   Signed by:   Jacques Navy MD on 09/15/2010   Method used:   Print then Give to Patient   RxID:   [id number] EFFEXOR XR 150 MG XR24H-CAP (VENLAFAXINE HCL) 1 by mouth q AM  #90 x 3   Entered and Authorized by:   Jacques Navy MD   Signed by:   Jacques Navy MD on 09/15/2010   Method used:   Print then Give to Patient   RxID:   [id number] AMLODIPINE BESYLATE 5 MG  TABS (AMLODIPINE BESYLATE) 1 once daily  #90 x 3   Entered and Authorized by:   Jacques Navy MD   Signed by:   Jacques Navy MD on 09/15/2010   Method used:   Print then Give to Patient   RxID:   [id number] PROCTOSOL HC 2.5 %  CREA (HYDROCORTISONE) as needed  #45g x 3   Entered and Authorized by:   Jacques Navy MD   Signed by:   Jacques Navy MD on 09/15/2010   Method used:   Print then Give to Patient   RxID:   [id number] FINASTERIDE 5 MG TABS (FINASTERIDE) 1 by mouth once daily  #90 x 3   Entered and Authorized by:   Jacques Navy MD   Signed by:   Jacques Navy MD on 09/15/2010   Method used:   Print then Give to Patient   RxID:   [id number] ACCUPRIL 20 MG  TABS (QUINAPRIL HCL) 1/2 once daily  #45 x 3   Entered and  Authorized by:   Jacques Navy MD   Signed by:   Jacques Navy MD on 09/15/2010   Method used:   Print then Give to Patient   RxID:   [id number] CRESTOR 40 MG  TABS (ROSUVASTATIN CALCIUM) Take 1 tablet by mouth once a day  #90 x 3   Entered and Authorized by:   Jacques Navy MD   Signed by:   Jacques Navy MD on 09/15/2010   Method used:   Print then Give to  Patient   RxID:   [id number] DOXAZOSIN MESYLATE 4 MG TABS (DOXAZOSIN MESYLATE) 1 by mouth at bedtime  #90 x 3   Entered and Authorized by:   Jacques Navy MD   Signed by:   Jacques Navy MD on 09/15/2010   Method used:   Print then Give to Patient   RxID:   [id number]    Orders Added: 1)  Tdap => 35yrs IM [84696] 2)  Admin 1st Vaccine [90471] 3)  TLB-BMP (Basic Metabolic Panel-BMET) [80048-METABOL] 4)  TLB-Lipid Panel [80061-LIPID] 5)  TLB-Hepatic/Liver Function Pnl [80076-HEPATIC] 6)  TLB-CBC Platelet - w/Differential [85025-CBCD] 7)  Medicare -1st Annual Wellness Visit [G0438] 8)  Est. Patient Level IV [29528]   Immunizations Administered:  Tetanus Vaccine:    Vaccine Type: Tdap    Site: right deltoid    Mfr: GlaxoSmithKline    Dose: 0.5 ml    Route: IM    Given by: Ami Bullins CMA    Exp. Date: 07/30/2012    Lot #: [id number]    VIS given: 08/28/08 version given September 15, 2010.   Immunizations Administered:  Tetanus Vaccine:    Vaccine Type: Tdap    Site: right deltoid    Mfr: GlaxoSmithKline    Dose: 0.5 ml    Route: IM    Given by: Ami Bullins CMA    Exp. Date: 07/30/2012    Lot #: [id number]    VIS given: 08/28/08 version given September 15, 2010.

## 2010-11-10 NOTE — Progress Notes (Signed)
Summary: RF   Phone Note Call from Patient   Summary of Call: Needs 5 day supply of gen effexor while waiting on mail order.  Initial call taken by: Lamar Sprinkles, CMA,  September 14, 2010 5:27 PM  Follow-up for Phone Call        Pt informed  Follow-up by: Lamar Sprinkles, CMA,  September 14, 2010 5:29 PM    Prescriptions: EFFEXOR XR 75 MG  CP24 (VENLAFAXINE HCL) Take 1 tab by mouth at bedtime  #10 x 0   Entered by:   Lamar Sprinkles, CMA   Authorized by:   Jacques Navy MD   Signed by:   Lamar Sprinkles, CMA on 09/14/2010   Method used:   Electronically to        [facility]. [phone]* (retail)       [address]       [place], [place]  [address]       Ph: [phone]       Fax: [phone]   RxID:   [id number]

## 2010-11-10 NOTE — Progress Notes (Signed)
Summary: med refill  Phone Note Refill Request Message from:  Patient on January 07, 2010 1:15 PM  Refills Requested: Medication #1:  ACCUPRIL 20 MG  TABS 1/2 once daily  Medication #2:  FINASTERIDE 5 MG TABS 1 by mouth once daily Patient spouse states that they are not able to get refills from [facility]. They finally did get the patient Effexor. Spouse is aware I will re-send he above prescription's.  Initial call taken by: Lucious Groves,  January 07, 2010 1:17 PM    Prescriptions: FINASTERIDE 5 MG TABS (FINASTERIDE) 1 by mouth once daily  #90 x 3   Entered by:   Lucious Groves   Authorized by:   Jacques Navy MD   Signed by:   Lucious Groves on 01/07/2010   Method used:   Faxed to ...       [facility]* (mail-order)             ,          Ph: [phone]       Fax: [phone]   RxID:   [id number] ACCUPRIL 20 MG  TABS (QUINAPRIL HCL) 1/2 once daily  #45 x 3   Entered by:   Lucious Groves   Authorized by:   Jacques Navy MD   Signed by:   Lucious Groves on 01/07/2010   Method used:   Faxed to ...       [facility]* (mail-order)             ,          Ph: [phone]       Fax: [phone]   RxID:   [id number]

## 2010-11-10 NOTE — Progress Notes (Signed)
Summary: ED   Phone Note Call from Patient   Summary of Call: FYI - Patient has put in request for post-vac for ED. He would like MD to ok this order when recieved by office. If MD does not think pt needs this he would like to know.  Initial call taken by: Lamar Sprinkles, CMA,  May 25, 2010 2:55 PM  Follow-up for Phone Call        k Follow-up by: Jacques Navy MD,  May 26, 2010 8:21 AM

## 2010-11-10 NOTE — Progress Notes (Signed)
  Phone Note Call from Patient Call back at Home Phone [phone]   Caller: Spouse Reason for Call: Refill Medication Summary of Call: Patient wife lmovm stating that he need a refill of Doxazoin sent to [facility] today, stats he is all out. Thanks.Marland KitchenMarland KitchenAlvy Beal Archie CMA  May 25, 2010 1:47 PM     Prescriptions: DOXAZOSIN MESYLATE 8 MG  TABS (DOXAZOSIN MESYLATE) at bedtime  #90 x 3   Entered by:   Bill Salinas CMA   Authorized by:   Jacques Navy MD   Signed by:   Bill Salinas CMA on 05/25/2010   Method used:   Electronically to        [facility] [address] (retail)       [id number]       [place], [place]  [address]       Ph: [phone]       Fax: [phone]   RxID:   [id number]

## 2010-11-10 NOTE — Miscellaneous (Signed)
Summary: [facility] Previsit/prep  Clinical Lists Changes  Medications: Added new medication of MOVIPREP 100 GM  SOLR (PEG-KCL-NACL-NASULF-NA ASC-C) As per prep instructions. - Signed Rx of MOVIPREP 100 GM  SOLR (PEG-KCL-NACL-NASULF-NA ASC-C) As per prep instructions.;  #1 x 0;  Signed;  Entered by: Wyona Almas RN;  Authorized by: Rachael Fee MD;  Method used: Electronically to [facility]. [phone]*, [address], [place], [place], [place]  [address], Ph: [phone], Fax: [phone] Observations: Added new observation of NKA: T (08/11/2010 14:08)    Prescriptions: MOVIPREP 100 GM  SOLR (PEG-KCL-NACL-NASULF-NA ASC-C) As per prep instructions.  #1 x 0   Entered by:   Wyona Almas RN   Authorized by:   Rachael Fee MD   Signed by:   Wyona Almas RN on 08/11/2010   Method used:   Electronically to        [facility]. [phone]* (retail)       [address]       [place], [place]  [address]       Ph: [phone]       Fax: [phone]   RxID:   [id number]

## 2010-11-10 NOTE — Assessment & Plan Note (Signed)
Summary: dizzy off and on/SD   Vital Signs:  Patient profile:   70 year old male Height:      68 inches Weight:      259 pounds BMI:     39.52 O2 Sat:      96 % on Room air Temp:     97.0 degrees F oral Pulse rate:   58 / minute BP sitting:   110 / 68  (left arm) Cuff size:   large  Vitals Entered By: Bill Salinas CMA (June 18, 2010 1:57 PM)  O2 Flow:  Room air CC: pt here with c/o dizziness off and on x 2 months/ ab   Primary Care Provider:  Norins  CC:  pt here with c/o dizziness off and on x 2 months/ ab.  History of Present Illness: Patient presents with c/o positional vertigo: whenever he changes position, i.e. sitting to standing, etc, he will have 20-30 seconds of dizziness. He does not have dizziness at other times. He has no focal neurologic complaints-weakness, paresthesia. He has had no falls. He denies any tinnitus or hearing loss. He requests a full review of all is medications and the purpose of each drug.   Current Medications (verified): 1)  Prilosec Otc 20 Mg Tbec (Omeprazole Magnesium) .... Take 2 Tablet By Mouth Once A Day 2)  Doxazosin Mesylate 8 Mg  Tabs (Doxazosin Mesylate) .... At Bedtime 3)  Aspirin 81mg  .... Once Daily 4)  Effexor Xr 75 Mg  Cp24 (Venlafaxine Hcl) .... Take 1 Tab By Mouth At Bedtime 5)  Garlic 6)  Multivitamins   Tabs (Multiple Vitamin) .... Take 1 Tablet By Mouth Once A Day 7)  B-Complex 8)  Vitamin C 9)  Proctosol Hc 2.5 %  Crea (Hydrocortisone) .... As Needed 10)  Clotrimazole-Betamethasone 1-0.05 %  Lotn (Clotrimazole-Betamethasone) .... As Needed 11)  Crestor 40 Mg  Tabs (Rosuvastatin Calcium) .... Take 1 Tablet By Mouth Once A Day 12)  Amlodipine Besylate 5 Mg  Tabs (Amlodipine Besylate) .Marland Kitchen.. 1 Once Daily 13)  Accupril 20 Mg  Tabs (Quinapril Hcl) .... 1/2 Once Daily 14)  Finasteride 5 Mg Tabs (Finasteride) .Marland Kitchen.. 1 By Mouth Once Daily 15)  Alprazolam 0.5 Mg  Tabs (Alprazolam) .Marland Kitchen.. 1 Every 6 Hrs As Needed For Agitation,   Anger. 16)  Effexor Xr 150 Mg Xr24h-Cap (Venlafaxine Hcl) .Marland Kitchen.. 1 By Mouth Q Am 17)  Mucus Relief Dm 20-400 Mg Tabs (Dextromethorphan-Guaifenesin) .... Take 1 Tablet By Mouth Every 4-6 Hours As Needed  Allergies (verified): No Known Drug Allergies  Past History:  Past Medical History: Last updated: 05/29/2007 Anxiety Depression GERD Hyperlipidemia Hypertension Benign prostatic hypertrophy  Past Surgical History: Last updated: 09/11/2009 Cholecystectomy Sinus surgery Septoplasty Tonsillectomy Disc Surgery teeth extracted - '08 Cataract with IOL OD Catarct with IOL OS-complications requiring laser therapy and drops  Family History: Last updated: Oct 23, 2007 father-died 03/19/58, lung cancer with mets mother- died 72, brain tumor, HTN, lipids, OA 2 brothers - one died unkown causes; gout Neg- colon, prostate cancer; DM; CAD  Social History: Last updated: 09/11/2009 [employer]; [employer]- [employer] married 03-19-65 3 boys- '68, '73, 1973-03-19 3 grandchildren Disability; 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 artificial feeding or hydration in the long term.   Review of Systems  The patient denies anorexia, fever, weight loss, hoarseness, chest pain, syncope, dyspnea on exertion, headaches, abdominal pain, muscle  weakness, difficulty walking, and enlarged lymph nodes.    Physical Exam  General:  overweight white male in no distress Head:  normocephalic and atraumatic.   Eyes:  pupils equal, pupils round, corneas and lenses clear, and no injection.   Ears:  R ear normal and L ear normal.   Neck:  supple and full ROM.   Lungs:  normal respiratory effort.   Heart:  normal rate and regular rhythm.   Msk:  normal ROM.   Pulses:  2+ radial Neurologic:  alert & oriented X3, cranial nerves II-XII intact, strength normal in all extremities,  gait normal, tandem gait normal and finger-to-nose normal.   Skin:  turgor normal, color normal, and no purpura.   Psych:  Oriented X3, memory intact for recent and remote, normally interactive, and good eye contact.     Impression & Recommendations:  Problem # 1:  BENIGN POSITIONAL VERTIGO (ICD-386.11) explained the mechanism of carotid dysautonomia and positional vertigo. Symptoms are mild  Plan - "rule of 20."  The following medications were removed from the medication list:    Zyrtec Allergy 10 Mg Caps (Cetirizine hcl) .Marland Kitchen... 1 tab at bedtime  Problem # 2:  medication review reviewed all of the patients medications: doses, purpose. Made one change - decreased doxazosin to 4mg  once daily.   (greater than 25% of visit on eduation and counselling)  Complete Medication List: 1)  Prilosec Otc 20 Mg Tbec (Omeprazole magnesium) .... Take 2 tablet by mouth once a day 2)  Doxazosin Mesylate 4 Mg Tabs (Doxazosin mesylate) .Marland Kitchen.. 1 by mouth at bedtime 3)  Aspirin 81mg   .... Once daily 4)  Effexor Xr 75 Mg Cp24 (Venlafaxine hcl) .... Take 1 tab by mouth at bedtime 5)  Garlic  6)  Multivitamins Tabs (Multiple vitamin) .... Take 1 tablet by mouth once a day 7)  B-complex  8)  Vitamin C  9)  Proctosol Hc 2.5 % Crea (Hydrocortisone) .... As needed 10)  Clotrimazole-betamethasone 1-0.05 % Lotn (Clotrimazole-betamethasone) .... As needed 11)  Crestor 40 Mg Tabs (Rosuvastatin calcium) .... Take 1 tablet by mouth once a day 12)  Amlodipine Besylate 5 Mg Tabs (Amlodipine besylate) .Marland Kitchen.. 1 once daily 13)  Accupril 20 Mg Tabs (Quinapril hcl) .... 1/2 once daily 14)  Finasteride 5 Mg Tabs (Finasteride) .Marland Kitchen.. 1 by mouth once daily 15)  Alprazolam 0.5 Mg Tabs (Alprazolam) .Marland Kitchen.. 1 every 6 hrs as needed for agitation,  anger. 16)  Effexor Xr 150 Mg Xr24h-cap (Venlafaxine hcl) .Marland Kitchen.. 1 by mouth q am 17)  Mucus Relief Dm 20-400 Mg Tabs (Dextromethorphan-guaifenesin) .... Take 1 tablet by mouth every 4-6 hours as  needed  Patient Instructions: 1)  dizziness is related to carotid dysautonomia and less likely to low blood pressure. 2)  Reviewed all medications: recommend reducing doxazosin to 4mg  once a day (1/2 of 8 until next Rx)

## 2010-11-10 NOTE — Letter (Signed)
Summary: Moviprep Instructions  [facility]  [address]   [place], [place] [address]   Phone: [phone]  Fax: [phone]       VA BROADWELL    [birth year]    MRN: [id number]        Procedure Day /Date: Wednesday, 08-26-10     Arrival Time: 1:00 p.m.     Procedure Time: 2:00 p.m.     Location of Procedure:                    x  [facility] (4th Floor)   PREPARATION FOR COLONOSCOPY WITH MOVIPREP   Starting 5 days prior to your procedure 08-21-10  do not eat nuts, seeds, popcorn, corn, beans, peas,  salads, or any raw vegetables.  Do not take any fiber supplements (e.g. Metamucil, Citrucel, and Benefiber).  THE DAY BEFORE YOUR PROCEDURE         DATE:  08-25-10 DAY: Tuesday  1.  Drink clear liquids the entire day-NO SOLID FOOD  2.  Do not drink anything colored red or purple.  Avoid juices with pulp.  No orange juice.  3.  Drink at least 64 oz. (8 glasses) of fluid/clear liquids during the day to prevent dehydration and help the prep work efficiently.  CLEAR LIQUIDS INCLUDE: Water Jello Ice Popsicles Tea (sugar ok, no milk/cream) Powdered fruit flavored drinks Coffee (sugar ok, no milk/cream) Gatorade Juice: apple, white grape, white cranberry  Lemonade Clear bullion, consomm, broth Carbonated beverages (any kind) Strained chicken noodle soup Hard Candy                             4.  In the morning, mix first dose of MoviPrep solution:    Empty 1 Pouch A and 1 Pouch B into the disposable container    Add lukewarm drinking water to the top line of the container. Mix to dissolve    Refrigerate (mixed solution should be used within 24 hrs)  5.  Begin drinking the prep at 5:00 p.m. The MoviPrep container is divided by 4 marks.   Every 15 minutes drink the solution down to the next mark (approximately 8 oz) until the full liter is complete.   6.  Follow completed prep with 16 oz of clear liquid of your choice (Nothing red or  purple).  Continue to drink clear liquids until bedtime.  7.  Before going to bed, mix second dose of MoviPrep solution:    Empty 1 Pouch A and 1 Pouch B into the disposable container    Add lukewarm drinking water to the top line of the container. Mix to dissolve    Refrigerate  THE DAY OF YOUR PROCEDURE      DATE: 08-26-10  DAY: Wednesday  Beginning at 9:00 a.m. (5 hours before procedure):         1. Every 15 minutes, drink the solution down to the next mark (approx 8 oz) until the full liter is complete.  2. Follow completed prep with 16 oz. of clear liquid of your choice.    3. You may drink clear liquids until 12:00 p.m. (2 HOURS BEFORE PROCEDURE).   MEDICATION INSTRUCTIONS  Unless otherwise instructed, you should take regular prescription medications with a small sip of water   as early as possible the morning of your procedure.        OTHER INSTRUCTIONS  You will need a responsible adult at least  68 years of age to accompany you and drive you home.   This person must remain in the waiting room during your procedure.  Wear loose fitting clothing that is easily removed.  Leave jewelry and other valuables at home.  However, you may wish to bring a book to read or  an iPod/MP3 player to listen to music as you wait for your procedure to start.  Remove all body piercing jewelry and leave at home.  Total time from sign-in until discharge is approximately 2-3 hours.  You should go home directly after your procedure and rest.  You can resume normal activities the  day after your procedure.  The day of your procedure you should not:   Drive   Make legal decisions   Operate machinery   Drink alcohol   Return to work  You will receive specific instructions about eating, activities and medications before you leave.    The above instructions have been reviewed and explained to me by   Wyona Almas RN  August 11, 2010 2:36 PM    I fully understand and  can verbalize these instructions _____________________________ Date _________

## 2010-11-10 NOTE — Progress Notes (Signed)
Summary: OV TOMORROW  Phone Note Call from Patient   Summary of Call: Pt's wife called b/c pt is dizzy. Pt c/o dizzy "spells" off and on for "a while now". Pt's home BP machine gave reading of 116/68 pulse 61. No fever, sob, cp, pain, vision changes or any other complaints. Symptoms get worse upon standing at times. Advised pt to get up more slowly and pause before moving forward. Also advised ER or call office if symptoms became severe. Pt will be working tomorrow am and he is scheduled for f/u tomorrow afternoon.  Initial call taken by: Lamar Sprinkles, CMA,  June 17, 2010 4:31 PM  Follow-up for Phone Call        noted  Follow-up by: Jacques Navy MD,  June 17, 2010 6:43 PM

## 2010-11-10 NOTE — Assessment & Plan Note (Signed)
Summary: h/a, leg weakness, body aches / SD   Vital Signs:  Patient profile:   70 year old male Height:      68 inches Weight:      258 pounds BMI:     39.37 O2 Sat:      97 % on Room air Temp:     98.0 degrees F oral Pulse rate:   55 / minute BP sitting:   120 / 72  (left arm) Cuff size:   large  Vitals Entered By: Bill Salinas CMA (October 14, 2009 4:58 PM)  O2 Flow:  Room air CC: pt here with complaint of headache with weakness and fatigue x 2day/ ab   Primary Care Provider:  Norins  CC:  pt here with complaint of headache with weakness and fatigue x 2day/ ab.  History of Present Illness: Patient presents for headache and weakness for several days. Location is frontal, pain is constant, he can go to sleep but the headach will wake him up. He has taken aspirin with little relief - 2 81mg  doses. Has had mild blurred vision. He has had a prolem with the left eye since cataract surgery and a failed IOL. No fever, no rigors. Some rhinorrhe - clear. No pain in the maxilla. Mild cough.   Current Medications (verified): 1)  Prilosec Otc 20 Mg Tbec (Omeprazole Magnesium) .... Take 2 Tablet By Mouth Once A Day 2)  Doxazosin Mesylate 8 Mg  Tabs (Doxazosin Mesylate) .... At Bedtime 3)  Aspirin 81mg  .... Once Daily 4)  Effexor Xr 75 Mg  Cp24 (Venlafaxine Hcl) .... Take 1 Tab By Mouth At Bedtime 5)  Garlic 6)  Multivitamins   Tabs (Multiple Vitamin) .... Take 1 Tablet By Mouth Once A Day 7)  B-Complex 8)  Vitamin C 9)  Vitamin E 10)  Proctosol Hc 2.5 %  Crea (Hydrocortisone) .... As Needed 11)  Clotrimazole-Betamethasone 1-0.05 %  Lotn (Clotrimazole-Betamethasone) .... As Needed 12)  Crestor 40 Mg  Tabs (Rosuvastatin Calcium) .... Take 1 Tablet By Mouth Once A Day 13)  Amlodipine Besylate 5 Mg  Tabs (Amlodipine Besylate) .Marland Kitchen.. 1 Once Daily 14)  Accupril 20 Mg  Tabs (Quinapril Hcl) .... 1/2 Once Daily 15)  Finasteride 5 Mg Tabs (Finasteride) .Marland Kitchen.. 1 By Mouth Once Daily 16)  Alprazolam  0.5 Mg  Tabs (Alprazolam) .Marland Kitchen.. 1 Every 6 Hrs As Needed For Agitation,  Anger. 17)  Effexor Xr 150 Mg Xr24h-Cap (Venlafaxine Hcl) .Marland Kitchen.. 1 By Mouth Q Am 18)  Mucus Relief Dm 20-400 Mg Tabs (Dextromethorphan-Guaifenesin) .... Take 1 Tablet By Mouth Every 4-6 Hours As Needed 19)  Zyrtec Allergy 10 Mg Caps (Cetirizine Hcl) .Marland Kitchen.. 1 Tab At Bedtime  Allergies (verified): No Known Drug Allergies  Past History:  Past Medical History: Last updated: 05/29/2007 Anxiety Depression GERD Hyperlipidemia Hypertension Benign prostatic hypertrophy  Past Surgical History: Last updated: 09/11/2009 Cholecystectomy Sinus surgery Septoplasty Tonsillectomy Disc Surgery teeth extracted - '08 Cataract with IOL OD Catarct with IOL OS-complications requiring laser therapy and drops  Family History: Last updated: 09/28/07 father-died 2058/02/23, lung cancer with mets mother- died 55, brain tumor, HTN, lipids, OA 2 brothers - one died unkown causes; gout Neg- colon, prostate cancer; DM; CAD  Social History: Last updated: 09/11/2009 [employer]-BA; [employer]- [employer] married 1965/02/23 3 boys- '68, '73, 1973-02-23 3 grandchildren Disability; 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 artificial feeding or hydration in the long term.   Risk Factors: Exercise: yes (09/25/2007)  Risk Factors: Smoking Status: quit (05/29/2007)  Physical Exam  General:  Well-developed,well-nourished,in no acute distress; alert,appropriate and cooperative throughout examination Head:  tender over frontal and maxillary sinus Neck:  supple and full ROM.   Chest Wall:  no deformities and no tenderness.   Lungs:  normal respiratory effort, no intercostal retractions, no accessory muscle use, normal breath sounds, no crackles, and no wheezes.   Heart:  normal rate, regular rhythm, and no murmur.     Msk:  normal ROM, no joint tenderness, no redness over joints, no joint deformities, and no joint instability.   Skin:  turgor normal, color normal, no rashes, and no purpura.   Cervical Nodes:  no anterior cervical adenopathy and no posterior cervical adenopathy.   Psych:  Oriented X3, memory intact for recent and remote, and normally interactive.     Impression & Recommendations:  Problem # 1:  URI (ICD-465.9) Viral URI with no evidence of bacterila infection or indication for antibiotics.   Plan supportive care - see instr.  His updated medication list for this problem includes:    Mucus Relief Dm 20-400 Mg Tabs (Dextromethorphan-guaifenesin) .Marland Kitchen... Take 1 tablet by mouth every 4-6 hours as needed    Zyrtec Allergy 10 Mg Caps (Cetirizine hcl) .Marland Kitchen... 1 tab at bedtime  Complete Medication List: 1)  Prilosec Otc 20 Mg Tbec (Omeprazole magnesium) .... Take 2 tablet by mouth once a day 2)  Doxazosin Mesylate 8 Mg Tabs (Doxazosin mesylate) .... At bedtime 3)  Aspirin 81mg   .... Once daily 4)  Effexor Xr 75 Mg Cp24 (Venlafaxine hcl) .... Take 1 tab by mouth at bedtime 5)  Garlic  6)  Multivitamins Tabs (Multiple vitamin) .... Take 1 tablet by mouth once a day 7)  B-complex  8)  Vitamin C  9)  Vitamin E  10)  Proctosol Hc 2.5 % Crea (Hydrocortisone) .... As needed 11)  Clotrimazole-betamethasone 1-0.05 % Lotn (Clotrimazole-betamethasone) .... As needed 12)  Crestor 40 Mg Tabs (Rosuvastatin calcium) .... Take 1 tablet by mouth once a day 13)  Amlodipine Besylate 5 Mg Tabs (Amlodipine besylate) .Marland Kitchen.. 1 once daily 14)  Accupril 20 Mg Tabs (Quinapril hcl) .... 1/2 once daily 15)  Finasteride 5 Mg Tabs (Finasteride) .Marland Kitchen.. 1 by mouth once daily 16)  Alprazolam 0.5 Mg Tabs (Alprazolam) .Marland Kitchen.. 1 every 6 hrs as needed for agitation,  anger. 17)  Effexor Xr 150 Mg Xr24h-cap (Venlafaxine hcl) .Marland Kitchen.. 1 by mouth q am 18)  Mucus Relief Dm 20-400 Mg Tabs (Dextromethorphan-guaifenesin) .... Take 1 tablet by  mouth every 4-6 hours as needed 19)  Zyrtec Allergy 10 Mg Caps (Cetirizine hcl) .Marland Kitchen.. 1 tab at bedtime  Patient Instructions: 1)  Viral URI - plan - pseudoephedrine 30mg  three times a day for sinus congestion/pressure; loratadine 10mg  once a day for drip. For headache ok to take advil 2-3 tabs every 8 hours or aleve every 12 hours. Cough syrup of choice. Lots of fluids, Vitamin C.

## 2010-11-10 NOTE — Medication Information (Signed)
Summary: Vaccum Therapy System/Pos-T-Vac  Vaccum Therapy System/Pos-T-Vac   Imported By: Sherian Rein 05/29/2010 14:59:10  _____________________________________________________________________  External Attachment:    Type:   Image     Comment:   External Document

## 2010-11-12 NOTE — Progress Notes (Signed)
Summary: RF-while waiting on mail order  Phone Note Call from Patient Call back at Home Phone [phone]   Caller: Wife Summary of Call: Wife called, pt was given new rx's at last office visit. They mailed to pharm but pharm says they were not recieved. Needs  2wk supply to [facility] [address]. Done, need to call pt to inform Initial call taken by: Lamar Sprinkles, CMA,  October 08, 2010 12:39 PM  Follow-up for Phone Call        Pt informed of rx's Follow-up by: Lamar Sprinkles, CMA,  October 08, 2010 1:57 PM    Prescriptions: EFFEXOR XR 150 MG XR24H-CAP (VENLAFAXINE HCL) 1 by mouth q AM  #90 x 3   Entered by:   Lamar Sprinkles, CMA   Authorized by:   Jacques Navy MD   Signed by:   Lamar Sprinkles, CMA on 10/08/2010   Method used:   Electronically to        [facility]* (retail)             ,          Ph: [phone]       Fax: [phone]   RxID:   [id number] AMLODIPINE BESYLATE 5 MG  TABS (AMLODIPINE BESYLATE) 1 once daily  #90 x 3   Entered by:   Lamar Sprinkles, CMA   Authorized by:   Jacques Navy MD   Signed by:   Lamar Sprinkles, CMA on 10/08/2010   Method used:   Electronically to        [facility]* (retail)             ,          Ph: [phone]       Fax: [phone]   RxID:   [id number] EFFEXOR XR 150 MG XR24H-CAP (VENLAFAXINE HCL) 1 by mouth q AM  #15 x 0   Entered by:   Lamar Sprinkles, CMA   Authorized by:   Jacques Navy MD   Signed by:   Lamar Sprinkles, CMA on 10/08/2010   Method used:   Electronically to        [facility] [phone]* (retail)       [address]       [place], [place]  [address]       Ph: [phone]       Fax: [phone]   RxID:   [id number] AMLODIPINE BESYLATE 5 MG  TABS (AMLODIPINE BESYLATE) 1 once daily  #15 x 0   Entered by:   Lamar Sprinkles, CMA   Authorized by:   Jacques Navy MD   Signed by:   Lamar Sprinkles, CMA on 10/08/2010   Method used:   Electronically to       [facility] [phone]* (retail)       [address]       [place], [place]  [address]       Ph: [phone]       Fax: [phone]   RxID:   [id number]

## 2010-11-12 NOTE — Progress Notes (Signed)
  Phone Note Refill Request Message from:  Fax from Pharmacy on October 26, 2010 9:54 AM  Refills Requested: Medication #1:  ALPRAZOLAM 0.5 MG  TABS 1 every 6 hrs as needed for agitation   Last Refilled: 05/02/2010 Last ov was 09/15/2010, Please Advise refills  Initial call taken by: Ami Bullins CMA,  October 26, 2010 9:55 AM  Follow-up for Phone Call        ok to refill x 5 Follow-up by: Jacques Navy MD,  October 26, 2010 10:02 AM    Prescriptions: ALPRAZOLAM 0.5 MG  TABS (ALPRAZOLAM) 1 every 6 hrs as needed for agitation,  anger.  #180 x 5   Entered by:   Bill Salinas CMA   Authorized by:   Jacques Navy MD   Signed by:   Bill Salinas CMA on 10/26/2010   Method used:   Telephoned to ...       [facility] [address] [facility] [phone]* (retail)       [address]       [place], [place]  [address]       Ph: [phone]       Fax: [phone]   RxID:   [id number]

## 2011-02-16 ENCOUNTER — Telehealth: Payer: Self-pay | Admitting: *Deleted

## 2011-02-16 MED ORDER — QUINAPRIL HCL 10 MG PO TABS: 10.0000 mg | ORAL_TABLET | Freq: Every day | ORAL | Status: DC

## 2011-02-16 NOTE — Telephone Encounter (Signed)
Patient requesting refill of quinapril. He was advised to decrease from 20 mg to 10 mg (1/2 tab). Patient requesting RX for quinapril 10 mg 1 qd # 90 to go to [facility]

## 2011-02-16 NOTE — Telephone Encounter (Signed)
Ok for quinapril 10mg  # 90, 3 refills

## 2011-02-16 NOTE — Telephone Encounter (Signed)
Patient informed. 

## 2011-02-26 NOTE — Op Note (Signed)
NAMEGEARY, RUFO                ACCOUNT NO.:  [id number]   MEDICAL RECORD NO.:  [id number]          PATIENT TYPE:  AMB   LOCATION:  SDS                          FACILITY:  [facility]   PHYSICIAN:  Antony Contras, MD     DATE OF BIRTH:  10-30-1940   DATE OF PROCEDURE:  04/21/2006  DATE OF DISCHARGE:                                 OPERATIVE REPORT   PREOPERATIVE DIAGNOSES:  1.  Turbinate hypertrophy.  2.  Obstructive sleep apnea.   POSTOPERATIVE DIAGNOSES:  1.  Turbinate hypertrophy.  2.  Obstructive sleep apnea.  3.  Septal deviation.   SURGICAL PROCEDURES:  1.  Bilateral inferior turbinate reduction.  2.  Septoplasty.   SURGEON:  Antony Contras, MD   ANESTHESIA:  General endotracheal anesthesia.   COMPLICATIONS:  None.   INDICATIONS:  The patient is a 70 year old white male with a long history of  severe obstructive sleep apnea who had septal surgery in the 1970s by Dr.  Anner Crete for nasal obstruction.  He uses CPAP at night but has difficulty  breathing through his nose.  He presents for surgical management.   FINDINGS:  Inferior turbinates were large and hypertrophied bilaterally.  The nasal passages were quite tight and made placement of pledgets  difficult.  With the reduction of the turbinates, the septum was noted to  have a spur to the right side partially obstructing the airway.  This was  removed.   DESCRIPTION OF PROCEDURE:  The patient was identified in the holding room  and informed consent having been obtained, the patient was moved to the  operative suite and placed on the operating table in supine position.  Anesthesia was induced and the patient is intubated by the anesthesia team  without difficulty.  The patient was given intravenous antibiotics during  the case.  The eyes were taped closed and the face was prepped and draped in  a sterile fashion.  Afrin-soaked pledgets were placed in both sides of the  nose.  After several minutes the pledgets were  removed and the inferior  turbinates were injected with 1% lidocaine with 1:100,000 of epinephrine.  A  stab incision was made in the anterior turbinate using a 15 blade scalpel on  both sides.  The Diego microdebrider was then used to remove submucosal  tissue along the length of the turbinate on both sides.  The turbinates were  then pressed laterally with a Therapist, nutritional and mucosa redraped.  Prior to  using the [facility] microdebrider, a Therapist, nutritional was used to elevate tissue  off of the underlying bone.  At this point, the spur on the right side was  noted and the right-sided septum was then injected with 1% lidocaine with  1:100,000 of epinephrine.  A vertical incision was made in front of the spur  using a 15 blade scalpel through the mucosa and submucosa on the right side.  A Freer elevator was then used to elevate soft tissues off of the spur and  the bone was then divided anterior to the spur, elevating it from the  opposite  side soft tissues.  An osteotome was used to aid in making the cut  and the spur and surrounding bone was then removed.  Septal tissues were  then redraped.  Afrin pledgets were placed back in the nose for several  minutes.  These were then removed and soft tissues redraped again.  A Doyle  stent was then placed in the right side after removing the side channel and  coating it with bacitracin ointment.  This was secured to the anterior  septum using a vertical mattress through-and-through 2-0 chromic suture.  At  this point, the nose and throat were suctioned out.  The patient was  returned to Anesthesia for wake-up and was extubated moved to the recovery  room in stable condition.  The patient had mild bleeding during wake-up that  stopped right after.  The patient tolerated the procedure well.      Antony Contras, MD  Electronically Signed     DDB/MEDQ  D:  04/21/2006  T:  04/21/2006  Job:  [id number]

## 2011-02-26 NOTE — Assessment & Plan Note (Signed)
[facility]                           PRIMARY CARE OFFICE NOTE   NAME:[name]                       MRN:          [id number]  DATE:09/21/2006                            DOB:          1940-10-16    [name] is a 70 year old Caucasian gentleman who follows for  hypertension, depression, BPH, who presents for followup evaluation and  exam.  He was last seen in the office by pulmonary sleep medicine, Feb 10, 2006, and was started on CPAP.  I saw the patient December 09, 2005.  The  patient was last seen for a full physical exam September 08, 2005.   INTERVAL HISTORY:  1. GI:  Patient had a viral gastroenteritis with nausea, vomiting,      diarrhea, which is now resolved.  2. Coughing:  Productive cough with non-purulent mucous, and does have      a significant amount of post nasal drainage.  3. Obstructive sleep apnea.  Patient is using CPAP.  He reports that      he has gotten used to this.  He is getting better rest and has more      energy.   PAST MEDICAL HISTORY:  Well documented in multiple notes, including  September 22, 2004.   SURGICAL HISTORY:  1. Surgical history was reviewed, and patient did have a correction of      a deviated septum in 1979.  2. Laparoscopic cholecystectomy in the 1970s.  3. Lumbar discectomy in the 1960s.  4. Tonsillectomy, remote.  5. Turbinate reduction and septoplasty in July of 2007.   MEDICAL HISTORY:  1. Usual childhood diseases.  2. Hypertension.  3. Depression.  4. Hyperlipidemia.  5. Significant BPH.   CURRENT MEDICATIONS:  1. Omeprazole 20 mg b.i.d.  2. Doxazosin 8 mg q.h.s.  3. Lotrel 5/10 mg.  4. Once daily aspirin 81 mg.  5. Flax oil daily.  6. Proscar 5 mg daily.  7. Lipitor 40 mg daily.  8. Effexor XR 150 mg q. a.m., 75 mg q.h.s.  9. He takes garlic, multivitamin, B complex, vitamin C, vitamin E.  He      uses clotrimazole- betamethasone p.r.n.   REVIEW OF SYSTEMS:  Negative for any  constitutional problems.  He has  lost 16 pounds intentionally.  Patient has had an eye exam in the last  24 months.  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  He has had no cardiovascular complaints, he does have a cough as  noted, but no daytime problem, shortness of breath.  No GI, GU,  musculoskeletal complaints.  He does have a purulent rash, 1x2 cm on his  distal left lower extremity, and also a small area on the posterior left  thigh that is pruritic.   PHYSICAL EXAMINATION:  Temperature 97.4, blood pressure 130/78, pulse  66.  Weight 277, down 16 pounds from his last visit.  GENERAL APPEARANCE:  This is an obese Caucasian male, looks his stated  age, no acute distress.  HEENT:  Normocephalic, atraumatic.  EACs and TMs were unremarkable.  Oropharynx with  native dentition in poor repair with many teeth missing.  No gingivitis is noted, and no buckle lesions were noted.  Posterior  pharynx was clear.  Conjunctivae was clear.  Pupils are equal, round,  and reactive to light and accommodation.  Funduscopic exam with handheld  instrument was unremarkable.  NECK:  Supple without thyromegaly.  No lymphadenopathy was noted in the  cervical supraclavicular regions.  CHEST:  CVA tenderness.  Patient does have mild gynecomasty secondary to  his weight.  LUNGS: Clear with no rales, wheezes, or rhonchi.  CARDIOVASCULAR:  2+ radial pulses with no JVD or carotid bruits.  He had  a quiet precordium with regular rate and rhythm without murmurs, rubs,  or gallops.  ABDOMEN:  Obese.  There was no organosplenomegaly but his size  prohibited actuate exam.  RECTAL EXAM:  Normal sphincter tone was noted.  Patient does have  external hemorrhoids that are not tender or thrombosed.  Prostate gland  was smooth and round and normal in size.  EXTREMITIES:  Without cyanosis, clubbing, or edema deformity.  NEURO:  Nonfocal.  Rash.  The patient has small erythematous area on the  distal left lower  extremity.  On the posterior left thigh there is small erythematous  area.   LABORATORY DATA:  Hemoglobin was 14 gm, white count was 6100 with a  normal differential.  Chemistries were unremarkable with a serum glucose  of 106.  Liver function, kidney functions were normal.  Cholesterol was  153, triglycerides were 108.  HDL was 47.2, LDL was 84.  Thyroid  function normal.  TSH 2.33, PSA was normal at 0.4.  Urinalysis was  negative.   ASSESSMENT/PLAN:  1. Hypertension.  Patient is adequately controlled on his present      medical regimen and will continue the same.  2. Lipids.  Patient's cholesterol is at goal, and he will continue      with Lipitor 40.  3. BPH.  Patient does have nocturia times 2, but it is has been      markedly improved and he will continue on Doxazosin and Proscar.  4. GI:  Patient seems stable on Omeprazole 40 mg daily.  5. Health maintenance:  Patient's last colonoscopy was December 15, 2004,      and he is due for followup in 2011.   In summary, a pleasant gentleman, who has lost 16 pounds.  I have  encouraged him at this effort, and would like him to lose 16 pounds a  year through dietary control with decreased calorie intake and portion  size management.     Donald Gess Norins, MD  Electronically Signed    MEN/MedQ  DD: 09/21/2006  DT: 09/21/2006  Job #: [id number]

## 2011-02-26 NOTE — Procedures (Signed)
NAMETYMOTHY, CASS NO.:  [id number]   MEDICAL RECORD NO.:  [id number]          PATIENT TYPE:  OUT   LOCATION:  SLEEP CENTER                 FACILITY:  [facility]   PHYSICIAN:  Marcelyn Bruins, M.D. [facility] DATE OF BIRTH:  [birth year]   DATE OF STUDY:  12/30/2005                              NOCTURNAL POLYSOMNOGRAM   REFERRING PHYSICIAN:  Dr. Illene Regulus.   DATE OF STUDY:  December 30, 2005.   INDICATION FOR THE STUDY:  Hypersomnia with sleep apnea.   EPWORTH SCORE:  13.   SLEEP ARCHITECTURE:  The patient had a total sleep time of 326 minutes  however never achieved slow wave sleep or REM.  Sleep onset latency was  prolonged at 36 minutes.  Sleep efficiency was decreased at 75%.   RESPIRATORY DATA:  The patient underwent a split night study where he was  found to have 195 obstructive events in the first 123 minutes of sleep.  This gave the patient a Respiratory Disturbance Index of 96 events per hour.  There was severe snoring noted throughout.  By protocol the patient was  placed on a medium ResMed Ultram Mirage full face mask and CPAP titration  was initiated.  At a final CPAP pressure of 18 cm H2O there seemed to be  fairly good control of the patient's obstructive events and snoring however  there were a few breakthrough events.   OXYGEN DATA:  O2 desaturation as low as 83%.   CARDIAC DATA:  No clinically significant cardiac arrhythmias.   MOVEMENT/PARASOMNIAS:  The patient was found to have 109 leg jerks with two  per hour resulting in arousal awakening.   IMPRESSION/RECOMMENDATION:  1.  Severe obstructive sleep apnea with a Respiratory Disturbance Index of      96 events per hour during the first half of the night.  There was severe      snoring and O2 desaturation as low as 83%.  The patient was then placed      on a medium ResMed Ultram Mirage full face mask and ultimately titrated      to a final pressure of 18 cm.  Though there were a few breakthrough    events on this setting, control overall was very good and pressures      higher than this level often result in decreased compliance.  2.  Large numbers of leg jerks with mild sleep disruption.  It is unclear      how much of this is due to the patient's sleep disordered breathing, or      possibly due to a movement disorder.  I would treat the patient's sleep      apnea      first and if he continues to have sleep issues or symptoms consistent      with a restless leg syndrome would consider treating this entity as      well.           ______________________________  Marcelyn Bruins, M.D. [facility]  Diplomate, American Board of Sleep  Medicine     KC/MEDQ  D:  01/21/2006 16:01:31  T:  01/21/2006 23:27:20  Job:  [id number] 

## 2011-09-17 ENCOUNTER — Encounter: Payer: Self-pay | Admitting: Internal Medicine

## 2011-09-29 ENCOUNTER — Other Ambulatory Visit (INDEPENDENT_AMBULATORY_CARE_PROVIDER_SITE_OTHER): Payer: Medicare Other

## 2011-09-29 ENCOUNTER — Ambulatory Visit (INDEPENDENT_AMBULATORY_CARE_PROVIDER_SITE_OTHER): Payer: Medicare Other | Admitting: Internal Medicine

## 2011-09-29 ENCOUNTER — Encounter: Payer: Self-pay | Admitting: Internal Medicine

## 2011-09-29 DIAGNOSIS — K219 Gastro-esophageal reflux disease without esophagitis: Secondary | ICD-10-CM

## 2011-09-29 DIAGNOSIS — E785 Hyperlipidemia, unspecified: Secondary | ICD-10-CM

## 2011-09-29 DIAGNOSIS — N400 Enlarged prostate without lower urinary tract symptoms: Secondary | ICD-10-CM

## 2011-09-29 DIAGNOSIS — I10 Essential (primary) hypertension: Secondary | ICD-10-CM

## 2011-09-29 DIAGNOSIS — F411 Generalized anxiety disorder: Secondary | ICD-10-CM

## 2011-09-29 DIAGNOSIS — Z0000 Encounter for general adult medical examination without abnormal findings: Secondary | ICD-10-CM

## 2011-09-29 LAB — COMPREHENSIVE METABOLIC PANEL WITH GFR
ALT: 21 U/L (ref 0–53)
AST: 26 U/L (ref 0–37)
Albumin: 4.1 g/dL (ref 3.5–5.2)
Alkaline Phosphatase: 59 U/L (ref 39–117)
BUN: 14 mg/dL (ref 6–23)
CO2: 29 meq/L (ref 19–32)
Calcium: 9.4 mg/dL (ref 8.4–10.5)
Chloride: 106 meq/L (ref 96–112)
Creatinine, Ser: 0.9 mg/dL (ref 0.4–1.5)
GFR: 90.32 mL/min
Glucose, Bld: 117 mg/dL — ABNORMAL HIGH (ref 70–99)
Potassium: 4.1 meq/L (ref 3.5–5.1)
Sodium: 141 meq/L (ref 135–145)
Total Bilirubin: 0.5 mg/dL (ref 0.3–1.2)
Total Protein: 7.1 g/dL (ref 6.0–8.3)

## 2011-09-29 LAB — CBC WITH DIFFERENTIAL/PLATELET
Basophils Absolute: 0 K/uL (ref 0.0–0.1)
Basophils Relative: 0.4 % (ref 0.0–3.0)
Eosinophils Absolute: 0.3 K/uL (ref 0.0–0.7)
Eosinophils Relative: 3.7 % (ref 0.0–5.0)
HCT: 42.3 % (ref 39.0–52.0)
Hemoglobin: 14.3 g/dL (ref 13.0–17.0)
Lymphocytes Relative: 42.8 % (ref 12.0–46.0)
Lymphs Abs: 3.1 K/uL (ref 0.7–4.0)
MCHC: 33.7 g/dL (ref 30.0–36.0)
MCV: 93.8 fl (ref 78.0–100.0)
Monocytes Absolute: 0.7 K/uL (ref 0.1–1.0)
Monocytes Relative: 9.1 % (ref 3.0–12.0)
Neutro Abs: 3.2 K/uL (ref 1.4–7.7)
Neutrophils Relative %: 44 % (ref 43.0–77.0)
Platelets: 159 K/uL (ref 150.0–400.0)
RBC: 4.51 Mil/uL (ref 4.22–5.81)
RDW: 14.2 % (ref 11.5–14.6)
WBC: 7.3 K/uL (ref 4.5–10.5)

## 2011-09-29 LAB — HEPATIC FUNCTION PANEL
ALT: 21 U/L (ref 0–53)
AST: 26 U/L (ref 0–37)
Alkaline Phosphatase: 59 U/L (ref 39–117)
Bilirubin, Direct: 0.1 mg/dL (ref 0.0–0.3)
Total Bilirubin: 0.5 mg/dL (ref 0.3–1.2)

## 2011-09-29 LAB — LIPID PANEL
LDL Cholesterol: 54 mg/dL (ref 0–99)
Total CHOL/HDL Ratio: 2
VLDL: 24.8 mg/dL (ref 0.0–40.0)

## 2011-09-29 NOTE — Assessment & Plan Note (Addendum)
Tolerating medication without adverse effect.   Plan - lipid panel with recommendations to follow  Addendum - lipid panel reveal great control. No change in regimen

## 2011-09-29 NOTE — Assessment & Plan Note (Signed)
Interval history is negative for any major illness, surgery or injury. Physical exam remarkable for weight. Labs pending. He is current with colorectal cancer screening with last study Nov '11. He is current with immunizations: tetanus Dec '11; Pneumonia vaccine Dec '08.   In summary - a very nice man who appears to be medically stable. He is encouraged to develop an aerobic exercise program - walking is good. He is also encouraged to reduce portion size, make smart choices and to loose 1-2 lbs a month.   He will return as needed or in 1 year for follow-up.

## 2011-09-29 NOTE — Assessment & Plan Note (Signed)
Well controlled on PPI therapy. Will continue the same

## 2011-09-29 NOTE — Assessment & Plan Note (Signed)
Stable and doing well. 

## 2011-09-29 NOTE — Progress Notes (Signed)
Subjective:    [name], male    DOB: 1941/07/29, 70 y.o.   MRN: [id number]  HPI   The patient is here for annual Medicare wellness examination and management of other chronic and acute problems. He reports he is doing well   The risk factors are reflected in the social history.  The roster of all physicians providing medical care to patient - is listed in the Snapshot section of the chart.  Activities of daily living:  The patient is 100% inedpendent in all ADLs: dressing, toileting, feeding as well as independent mobility  Home safety : The patient has smoke detectors in the home. They wear seatbelts. No firearms at home  There is no risks for hepatitis, STDs or HIV. There is no   history of blood transfusion. They have no travel history to infectious disease endemic areas of the world.  The patient has not seen their dentist in the last six months the patient wears dentures. They have  seen their eye doctor in the last year. They admit to any hearing difficulty and have not had audiologic testing in the last year.  They do not  have excessive sun exposure. Discussed the need for sun protection: hats, long sleeves and use of sunscreen if there is significant sun exposure.   Diet: the importance of a healthy diet is discussed. They do have a healthy diet.  The patient does not have a regular exercise program; but does admit that he states busy and active with his part time job  The benefits of regular aerobic exercise were discussed.  Depression screen: there are no signs or vegative symptoms of depression- irritability, change in appetite, anhedonia, sadness/tearfullness.  Cognitive assessment: the patient manages all their financial and personal affairs and is actively engaged. They could relate day,date,year and events; recalled 2/3 objects at 3 minutes; performed clock-face test normally.  The following portions of the patient's history were reviewed and updated  as appropriate: allergies, current medications, past family history, past medical history,  past surgical history, past social history  and problem list.  Vision, hearing, body mass index were assessed and reviewed.   During the course of the visit the patient was educated and counseled about appropriate screening and preventive services including : fall prevention-did have a fall recently - loss of balance/missteped , diabetes screening, nutrition counseling, colorectal cancer screening, and recommended immunizations.  Past Medical History  Diagnosis Date  . Anxiety   . Depression   . GERD (gastroesophageal reflux disease)   . Hyperlipidemia   . Hypertension   . BPH (benign prostatic hyperplasia)    Past Surgical History  Procedure Date  . Cholecystectomy   . Nasal sinus surgery   . Septoplasty   . Tonsillectomy   . Teeth extracted     '08  . Cataract with iol od   . Cataract with iol os- complications requiring  laser therapy and drops    Family History  Problem Relation Age of Onset  . Hyperlipidemia Mother   . Hypertension Mother   . Cancer Father     lung cancer   History   Social History  . Marital Status: Married    Spouse Name: brenda    Number of Children: 3  . Years of Education: 18   Occupational History  . courier    Social History Main Topics  . Smoking status: Never Smoker   . Smokeless tobacco: Never Used  . Alcohol Use: No  .  Drug Use: No  . Sexually Active: Not Currently   Other Topics Concern  . Not on file   Social History Narrative   [place], [employer] [place]. Married 1966. 3 boys, - '68, '73, '743 grandchildren. 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 SO- medical problems.ACP -End of life: no cpr, no prolonged Ventilator support, would consider HD, no heroic life- sustaining therapy, e.g. Artificial feeding or hydration in the long term.       Review of Systems Constitutional:  Negative for fever,  chills, activity change and unexpected weight change.  HEENT:  Negative for hearing loss, ear pain, congestion, neck stiffness and postnasal drip. Negative for sore throat or swallowing problems. Negative for dental complaints.   Eyes: Negative for vision loss or change in visual acuity.  Respiratory: Negative for chest tightness and wheezing. Negative for DOE.   Cardiovascular: Negative for chest pain or palpitations. No decreased exercise tolerance Gastrointestinal: No change in bowel habit. No bloating or gas. Mild reflux or indigestion Genitourinary: Negative for urgency, frequency, flank pain and difficulty urinating. Nocturia x 1 Musculoskeletal: Negative for myalgias, back pain, arthralgias and gait problem.  Neurological: Negative for dizziness, tremors, weakness and headaches.  Hematological: Negative for adenopathy.  Psychiatric/Behavioral: Negative for behavioral problems and dysphoric mood.       Objective:   Physical Exam Vitals reviewed - stable Gen'l- obese white man in no distress HEENT - [place]/AT, EACs/TMs normal, C&S clear, fundoscopic exam deferred to opthal, oropharynx - full dentures, no buccal lesions, throat clear Neck - supple, no thyromegaly Nodes - negative submandibular, cervical, supraclavicular region PUlm - normal respirations w/o rales, wheezes Cor - 2+ radial, PT pulses, 1+ DP pulses, RRR w/o murmur or gallop Abdomen- ventral surgical scar, BS+, no guarding or rebound, exam hindered by girth Back - no CVAT, lumbar-sacral surgical scar Ext- no C/C/E, no deformity Neuro - A&O x 3, CN II-XII normal, MS 5/5, DTR's 2+ and equal, cerebellar - no tremor, normal gait Derm - clear  Lab Results  Component Value Date   WBC 7.3 09/29/2011   HGB 14.3 09/29/2011   HCT 42.3 09/29/2011   PLT 159.0 09/29/2011   GLUCOSE 117* 09/29/2011   CHOL 137 09/29/2011   TRIG 124.0 09/29/2011   HDL 58.40 09/29/2011   LDLDIRECT 96.4 09/19/2007   LDLCALC 54 09/29/2011   ALT 21  09/29/2011   ALT 21 09/29/2011   AST 26 09/29/2011   AST 26 09/29/2011   NA 141 09/29/2011   K 4.1 09/29/2011   CL 106 09/29/2011   CREATININE 0.9 09/29/2011   BUN 14 09/29/2011   CO2 29 09/29/2011   TSH 1.61 04/11/2008   PSA 0.43 09/11/2009            Assessment & Plan:

## 2011-09-29 NOTE — Assessment & Plan Note (Signed)
No significant symptoms - nocturia only once, no incontinence.

## 2011-09-30 ENCOUNTER — Encounter: Payer: Self-pay | Admitting: Internal Medicine

## 2011-10-29 ENCOUNTER — Telehealth: Payer: Self-pay | Admitting: Internal Medicine

## 2011-10-29 NOTE — Telephone Encounter (Signed)
Refill request for venlafaxine hcl 150mg . #30 with zero refills. Last refilled on 12.19.12. OK to refill?

## 2011-11-01 ENCOUNTER — Other Ambulatory Visit: Payer: Self-pay

## 2011-11-01 MED ORDER — FINASTERIDE 5 MG PO TABS: 5.0000 mg | ORAL_TABLET | Freq: Every day | ORAL | Status: DC

## 2011-11-01 MED ORDER — VENLAFAXINE HCL ER 150 MG PO CP24: 150.0000 mg | ORAL_CAPSULE | Freq: Every day | ORAL | Status: DC

## 2011-11-01 MED ORDER — DOXAZOSIN MESYLATE 8 MG PO TABS: 8.0000 mg | ORAL_TABLET | Freq: Every day | ORAL | Status: DC

## 2011-11-01 NOTE — Telephone Encounter (Signed)
OK to fill this prescription with additional refills x1. Needs OV w/Dr Norins Thank you!

## 2011-11-01 NOTE — Telephone Encounter (Signed)
Patients wife called requesting refill on Venlafaxine and finasteride as completely out of these medications. [facility]. Call back when completed [phone]. Also requesting doxazosin be sent in as well to [facility].  Patient wife has been informed.

## 2011-11-15 ENCOUNTER — Other Ambulatory Visit: Payer: Self-pay

## 2011-11-15 MED ORDER — AMLODIPINE BESYLATE 5 MG PO TABS: 5.0000 mg | ORAL_TABLET | Freq: Every day | ORAL | Status: DC

## 2011-11-15 MED ORDER — ROSUVASTATIN CALCIUM 40 MG PO TABS: 40.0000 mg | ORAL_TABLET | Freq: Every day | ORAL | Status: DC

## 2011-11-29 ENCOUNTER — Other Ambulatory Visit: Payer: Self-pay | Admitting: Internal Medicine

## 2011-12-01 NOTE — Telephone Encounter (Signed)
Alprazolam request [last refill 01.16.12 #[id number]]

## 2011-12-02 NOTE — Telephone Encounter (Signed)
Phoned in to pharmacy/SLS 

## 2012-02-02 ENCOUNTER — Encounter (INDEPENDENT_AMBULATORY_CARE_PROVIDER_SITE_OTHER): Payer: Medicare Other | Admitting: Ophthalmology

## 2012-02-02 DIAGNOSIS — H35379 Puckering of macula, unspecified eye: Secondary | ICD-10-CM

## 2012-02-02 DIAGNOSIS — H43819 Vitreous degeneration, unspecified eye: Secondary | ICD-10-CM

## 2012-02-02 DIAGNOSIS — H35359 Cystoid macular degeneration, unspecified eye: Secondary | ICD-10-CM

## 2012-02-14 ENCOUNTER — Other Ambulatory Visit: Payer: Self-pay

## 2012-02-14 MED ORDER — QUINAPRIL HCL 10 MG PO TABS: 10.0000 mg | ORAL_TABLET | Freq: Every day | ORAL | Status: DC

## 2012-05-10 ENCOUNTER — Other Ambulatory Visit: Payer: Self-pay

## 2012-05-10 MED ORDER — AMLODIPINE BESYLATE 5 MG PO TABS: 5.0000 mg | ORAL_TABLET | Freq: Every day | ORAL | Status: DC

## 2012-05-19 ENCOUNTER — Other Ambulatory Visit: Payer: Self-pay | Admitting: Internal Medicine

## 2012-05-19 MED ORDER — ROSUVASTATIN CALCIUM 40 MG PO TABS: 40.0000 mg | ORAL_TABLET | Freq: Every day | ORAL | Status: DC

## 2012-05-19 NOTE — Telephone Encounter (Signed)
The pt called the triage line and is hoping to get a refill of Crestor sent to the [facility] on [address].  Thanks!

## 2012-05-19 NOTE — Addendum Note (Signed)
Addended by: Elnora Morrison on: 05/19/2012 04:12 PM   Modules accepted: Orders

## 2012-05-19 NOTE — Telephone Encounter (Signed)
Refill crestor sent to [facility].

## 2012-08-14 ENCOUNTER — Other Ambulatory Visit: Payer: Self-pay | Admitting: *Deleted

## 2012-08-14 MED ORDER — QUINAPRIL HCL 10 MG PO TABS: 10.0000 mg | ORAL_TABLET | Freq: Every day | ORAL | Status: DC

## 2012-08-14 NOTE — Telephone Encounter (Signed)
R'cd fax from [facility] for refill of Quinapril.

## 2012-09-20 ENCOUNTER — Other Ambulatory Visit: Payer: Self-pay | Admitting: *Deleted

## 2012-09-20 MED ORDER — VENLAFAXINE HCL ER 150 MG PO CP24: 150.0000 mg | ORAL_CAPSULE | Freq: Every day | ORAL | Status: DC

## 2012-09-20 NOTE — Telephone Encounter (Signed)
R'cd fax from [facility] pharmacy for refill of Venlafaxine.

## 2012-09-22 ENCOUNTER — Other Ambulatory Visit: Payer: Self-pay | Admitting: *Deleted

## 2012-09-23 MED ORDER — ALPRAZOLAM 0.5 MG PO TABS: 0.5000 mg | ORAL_TABLET | Freq: Four times a day (QID) | ORAL | Status: DC | PRN

## 2012-09-25 ENCOUNTER — Other Ambulatory Visit: Payer: Self-pay | Admitting: Internal Medicine

## 2012-09-25 NOTE — Telephone Encounter (Signed)
Please call when rx is ready.  Pt is out of medicine.

## 2012-09-26 MED ORDER — ALPRAZOLAM 0.5 MG PO TABS: 0.5000 mg | ORAL_TABLET | Freq: Four times a day (QID) | ORAL | Status: DC | PRN

## 2012-10-05 ENCOUNTER — Other Ambulatory Visit: Payer: Self-pay | Admitting: *Deleted

## 2012-10-05 MED ORDER — AMLODIPINE BESYLATE 5 MG PO TABS: 5.0000 mg | ORAL_TABLET | Freq: Every day | ORAL | Status: DC

## 2012-10-10 ENCOUNTER — Encounter: Payer: Self-pay | Admitting: Internal Medicine

## 2012-10-10 ENCOUNTER — Other Ambulatory Visit (INDEPENDENT_AMBULATORY_CARE_PROVIDER_SITE_OTHER): Payer: Medicare Other

## 2012-10-10 ENCOUNTER — Ambulatory Visit (INDEPENDENT_AMBULATORY_CARE_PROVIDER_SITE_OTHER): Payer: Medicare Other | Admitting: Internal Medicine

## 2012-10-10 VITALS — BP 128/82 | HR 89 | Temp 98.2°F | Resp 12 | Ht 70.0 in | Wt 290.1 lb

## 2012-10-10 DIAGNOSIS — Z0000 Encounter for general adult medical examination without abnormal findings: Secondary | ICD-10-CM

## 2012-10-10 DIAGNOSIS — E785 Hyperlipidemia, unspecified: Secondary | ICD-10-CM

## 2012-10-10 DIAGNOSIS — I10 Essential (primary) hypertension: Secondary | ICD-10-CM

## 2012-10-10 DIAGNOSIS — F3289 Other specified depressive episodes: Secondary | ICD-10-CM

## 2012-10-10 DIAGNOSIS — N529 Male erectile dysfunction, unspecified: Secondary | ICD-10-CM

## 2012-10-10 DIAGNOSIS — H547 Unspecified visual loss: Secondary | ICD-10-CM

## 2012-10-10 DIAGNOSIS — F528 Other sexual dysfunction not due to a substance or known physiological condition: Secondary | ICD-10-CM

## 2012-10-10 DIAGNOSIS — F329 Major depressive disorder, single episode, unspecified: Secondary | ICD-10-CM

## 2012-10-10 LAB — HEPATIC FUNCTION PANEL
Alkaline Phosphatase: 71 U/L (ref 39–117)
Bilirubin, Direct: 0.1 mg/dL (ref 0.0–0.3)
Total Bilirubin: 0.8 mg/dL (ref 0.3–1.2)
Total Protein: 7.8 g/dL (ref 6.0–8.3)

## 2012-10-10 LAB — COMPREHENSIVE METABOLIC PANEL
AST: 29 U/L (ref 0–37)
Albumin: 4.4 g/dL (ref 3.5–5.2)
BUN: 18 mg/dL (ref 6–23)
Calcium: 10 mg/dL (ref 8.4–10.5)
Chloride: 103 mEq/L (ref 96–112)
Creatinine, Ser: 0.9 mg/dL (ref 0.4–1.5)
Glucose, Bld: 102 mg/dL — ABNORMAL HIGH (ref 70–99)
Potassium: 4.4 mEq/L (ref 3.5–5.1)

## 2012-10-10 LAB — LIPID PANEL
Cholesterol: 153 mg/dL (ref 0–200)
LDL Cholesterol: 71 mg/dL (ref 0–99)
Triglycerides: 122 mg/dL (ref 0.0–149.0)
VLDL: 24.4 mg/dL (ref 0.0–40.0)

## 2012-10-10 MED ORDER — CLOTRIMAZOLE-BETAMETHASONE 1-0.05 % EX LOTN: TOPICAL_LOTION | Freq: Two times a day (BID) | CUTANEOUS | Status: DC

## 2012-10-10 NOTE — Progress Notes (Signed)
Subjective:    [name], male    DOB: 1940/11/04, 71 y.o.   MRN: [id number]  HPI The patient is here for annual Medicare wellness examination and management of other chronic and acute problems.  He is s/p cataract extraction with complications on the left eye - has had laser treatment post-surgery. He continues to have trouble and would like a second opinion.   The risk factors are reflected in the social history.  The roster of all physicians providing medical care to patient - is listed in the Snapshot section of the chart.  Activities of daily living:  The patient is 100% inedpendent in all ADLs: dressing, toileting, feeding as well as independent mobility  Home safety : The patient has smoke detectors in the home. Falls - none. Home is not completely fall safe - needs grab bars in the bathroom. They wear seatbelts. No firearms at home . There is no violence in the home.   There is no risks for hepatitis, STDs or HIV. There is no history of blood transfusion. They have no travel history to infectious disease endemic areas of the world.  The patient has full dentures and has not seen their dentist. They have  seen their eye doctor in the last year - looking for a second opinion re: post cataract problems left eye. They admit to hearing difficulty and have not had audiologic testing in the last year.    They do not  have excessive sun exposure. Discussed the need for sun protection: hats, long sleeves and use of sunscreen if there is significant sun exposure.   Diet: the importance of a healthy diet is discussed. They do have a healthy  diet.  The patient has no regular exercise program.  The benefits of regular aerobic exercise were discussed.  Depression screen: there are no signs or vegative symptoms of depression- irritability, change in appetite, anhedonia, sadness/tearfullness- well controlled on present medication.  Cognitive assessment: the patient manages all  their financial and personal affairs and is actively engaged. They could relate day,date,year and events; recalled 2/3 objects at 3 minutes; performed clock-face test normally.  The following portions of the patient's history were reviewed and updated as appropriate: allergies, current medications, past family history, past medical history,  past surgical history, past social history  and problem list.  Past Medical History  Diagnosis Date  . Anxiety   . Depression   . GERD (gastroesophageal reflux disease)   . Hyperlipidemia   . Hypertension   . BPH (benign prostatic hyperplasia)    Past Surgical History  Procedure Date  . Cholecystectomy   . Nasal sinus surgery   . Septoplasty   . Tonsillectomy   . Teeth extracted     '08  . Cataract with iol od   . Cataract with iol os- complications requiring  laser therapy and drops    Family History  Problem Relation Age of Onset  . Hyperlipidemia Mother   . Hypertension Mother   . Cancer Father     lung cancer   History   Social History  . Marital Status: Married    Spouse Name: brenda    Number of Children: 3  . Years of Education: 18   Occupational History  . courier    Social History Main Topics  . Smoking status: Never Smoker   . Smokeless tobacco: Never Used  . Alcohol Use: No  . Drug Use: No  . Sexually Active: Not Currently  Other Topics Concern  . Not on file   Social History Narrative   [place], [place]. Married 1966. 3 boys, - '68, '73, '743 grandchildren. 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 SO- medical problems.ACP -End of life: no cpr, no prolonged Ventilator support, would consider HD, no heroic life- sustaining therapy, e.g. Artificial feeding or hydration in the long term.     Current Outpatient Prescriptions on File Prior to Visit  Medication Sig Dispense Refill  . ALPRAZolam (XANAX) 0.5 MG tablet Take 1 tablet (0.5 mg total) by mouth every 6 (six) hours as  needed for sleep.  90 tablet  3  . amLODipine (NORVASC) 5 MG tablet Take 1 tablet (5 mg total) by mouth daily.  90 tablet  1  . aspirin 81 MG tablet Take 81 mg by mouth daily.        Marland Kitchen b complex vitamins tablet Take 1 tablet by mouth daily.        . finasteride (PROSCAR) 5 MG tablet Take 1 tablet (5 mg total) by mouth daily.  90 tablet  3  . GARLIC PO Take by mouth.        . MULTIPLE VITAMIN PO Take by mouth.        . quinapril (ACCUPRIL) 10 MG tablet Take 1 tablet (10 mg total) by mouth daily.  90 tablet  0  . rosuvastatin (CRESTOR) 40 MG tablet Take 1 tablet (40 mg total) by mouth daily.  90 tablet  2  . venlafaxine XR (EFFEXOR-XR) 150 MG 24 hr capsule Take 1 capsule (150 mg total) by mouth daily.  90 capsule  0     Vision, hearing, body mass index were assessed and reviewed.   During the course of the visit the patient was educated and counseled about appropriate screening and preventive services including : fall prevention , diabetes screening, nutrition counseling, colorectal cancer screening, and recommended immunizations.    Review of Systems Constitutional:  Negative for fever, chills, activity change and unexpected weight change.  HEENT:  Negative for hearing loss, ear pain, congestion, neck stiffness and postnasal drip. Negative for sore throat or swallowing problems. Negative for dental complaints.   Eyes: Positive for vision loss or change in visual acuity, especially left eye  Respiratory: Negative for chest tightness and wheezing. Negative for DOE.   Cardiovascular: Negative for chest pain or palpitations. No decreased exercise tolerance Gastrointestinal: No change in bowel habit. No bloating or gas. No reflux or indigestion Genitourinary: Negative for flank pain and difficulty urinating. Positive for urgency and frequency Musculoskeletal: Negative for myalgias, back pain, arthralgias and gait problem.  Neurological: Negative for dizziness, tremors, weakness and headaches.    Hematological: Negative for adenopathy.  Psychiatric/Behavioral: Negative for behavioral problems and dysphoric mood.       Objective:   Physical Exam Filed Vitals:   10/10/12 1421  BP: 128/82  Pulse: 89  Temp: 98.2 F (36.8 C)  Resp: 12   Wt Readings from Last 3 Encounters:  10/10/12 290 lb 1.3 oz (131.579 kg)  09/29/11 288 lb (130.636 kg)  09/15/10 268 lb (121.564 kg)   Gen'l: Well nourished well developed white male in no acute distress  HEENT: Head: Normocephalic and atraumatic. Right Ear: External ear normal. EAC/TM nl. Left Ear: External ear normal.  EAC/TM nl. Nose: Nose normal. Mouth/Throat: Oropharynx is clear and moist. Dentition - edentulous. No buccal or palatal lesions. Posterior pharynx clear. Eyes: Conjunctivae and sclera clear. EOM intact. Pupils are  equal, round, and reactive to light. Right eye exhibits no discharge. Left eye exhibits no discharge. Neck: Normal range of motion. Neck supple. No JVD present. No tracheal deviation present. No thyromegaly present.  Cardiovascular: Normal rate, regular rhythm, no gallop, no friction rub, no murmur heard.      Quiet precordium. 2+ radial and DP pulses . No carotid bruits Pulmonary/Chest: Effort normal. No respiratory distress or increased WOB, no wheezes, no rales. No chest wall deformity or CVAT. Abdomen: Soft. Bowel sounds are normal in all quadrants. He exhibits no distension, no tenderness, no rebound or guarding, No heptosplenomegaly  Genitourinary:  deferred Musculoskeletal: Normal range of motion. He exhibits no edema and no tenderness.       Small and large joints without redness, synovial thickening or deformity. Full range of motion preserved about all small, median and large joints.  Lymphadenopathy:    He has no cervical or supraclavicular adenopathy.  Neurological: He is alert and oriented to person, place, and time. CN II-XII intact. DTRs 2+ and symmetrical biceps, radial and patellar tendons. Cerebellar  function normal with no tremor, rigidity, normal gait and station.  Skin: Skin is warm and dry. No rash noted. No erythema.  Psychiatric: He has a normal mood and affect. His behavior is normal. Thought content normal.   Lab Results  Component Value Date   WBC 7.3 09/29/2011   HGB 14.3 09/29/2011   HCT 42.3 09/29/2011   PLT 159.0 09/29/2011   GLUCOSE 102* 10/10/2012   CHOL 153 10/10/2012   TRIG 122.0 10/10/2012   HDL 57.60 10/10/2012   LDLDIRECT 96.4 09/19/2007   LDLCALC 71 10/10/2012        ALT 25 10/10/2012   AST 29 10/10/2012        NA 137 10/10/2012   K 4.4 10/10/2012   CL 103 10/10/2012   CREATININE 0.9 10/10/2012   BUN 18 10/10/2012   CO2 26 10/10/2012   TSH 1.61 04/11/2008   PSA 0.43 09/11/2009         Assessment & Plan:

## 2012-10-10 NOTE — Patient Instructions (Addendum)
You are doing OK except you need to get your weight under control. Three principles smart food choices, PORTION SIZE CONTROL, regular exercise - walking 30 minutes every day. You should try to loose 1 lb a month.  Lab today- results will be a part of the report I send you and you can check MyChart if you open an account.  Have a healthy and happy New Year.

## 2012-10-11 NOTE — Assessment & Plan Note (Signed)
Patient reports that he feels stable on SNRI and anxiolytics

## 2012-10-11 NOTE — Assessment & Plan Note (Signed)
Lipid panel reveals an LDL that is better than goal of 100 or less and HDL better than goal of 40+. Liver functions are normal.  Plan Continue present medication

## 2012-10-11 NOTE — Assessment & Plan Note (Signed)
BP Readings from Last 3 Encounters:  10/10/12 128/82  09/29/11 122/62  09/15/10 118/66   Good control on present medications. Labs are normal  Plan Continue present regimen

## 2012-10-11 NOTE — Assessment & Plan Note (Signed)
Interval medical history significant for cataract surgery with complications OS, otherwise benign. Physical exam notable for obesity. Lab results are in normal limits. He is current with colorectal cancer screening. He has aged out of prostate cancer screening ([facility] April '13 rec). Immunizations are up to date except he needs shingles vaccine - Rx provided. He is aware that he needs to address his weight issues - smart food choices, portion size control, and regular exercise, e.g. Walking 30 min at a brisk pace 3 times a week.  In summary - a pleasant man with several medical problems which appear to be stable at this time. He will work on weight loss, exercise and he will continue his present regimen.

## 2012-10-11 NOTE — Assessment & Plan Note (Signed)
Patient reports that PPD type 5 drugs haven't helped  Plan  If he wishes to pursue additional treatment will refer to Urology re: cavernous injections vs prosthetic devices.

## 2012-11-02 ENCOUNTER — Other Ambulatory Visit: Payer: Self-pay | Admitting: Internal Medicine

## 2012-11-06 ENCOUNTER — Other Ambulatory Visit: Payer: Self-pay | Admitting: *Deleted

## 2012-11-06 MED ORDER — QUINAPRIL HCL 10 MG PO TABS: 10.0000 mg | ORAL_TABLET | Freq: Every day | ORAL | Status: DC

## 2012-11-06 MED ORDER — AMLODIPINE BESYLATE 5 MG PO TABS: 5.0000 mg | ORAL_TABLET | Freq: Every day | ORAL | Status: DC

## 2012-11-06 MED ORDER — ROSUVASTATIN CALCIUM 40 MG PO TABS: 40.0000 mg | ORAL_TABLET | Freq: Every day | ORAL | Status: DC

## 2012-11-06 MED ORDER — FINASTERIDE 5 MG PO TABS: 5.0000 mg | ORAL_TABLET | Freq: Every day | ORAL | Status: DC

## 2012-11-06 NOTE — Telephone Encounter (Signed)
R'cd fax from [facility] for refill of Finasteride.

## 2012-11-06 NOTE — Telephone Encounter (Signed)
PATIENT NOTIFIED MEDS WERE SENT THIS AM TO [facility] THIS AM.

## 2012-11-15 ENCOUNTER — Other Ambulatory Visit: Payer: Self-pay | Admitting: *Deleted

## 2012-11-15 MED ORDER — QUINAPRIL HCL 10 MG PO TABS: 10.0000 mg | ORAL_TABLET | Freq: Every day | ORAL | Status: DC

## 2012-11-29 ENCOUNTER — Encounter (INDEPENDENT_AMBULATORY_CARE_PROVIDER_SITE_OTHER): Payer: Medicare Other | Admitting: Ophthalmology

## 2012-11-29 DIAGNOSIS — H27139 Posterior dislocation of lens, unspecified eye: Secondary | ICD-10-CM

## 2012-11-29 DIAGNOSIS — H35359 Cystoid macular degeneration, unspecified eye: Secondary | ICD-10-CM

## 2012-11-29 DIAGNOSIS — I10 Essential (primary) hypertension: Secondary | ICD-10-CM

## 2012-11-29 DIAGNOSIS — H35039 Hypertensive retinopathy, unspecified eye: Secondary | ICD-10-CM

## 2012-12-01 DIAGNOSIS — H27139 Posterior dislocation of lens, unspecified eye: Secondary | ICD-10-CM

## 2012-12-01 NOTE — H&P (Signed)
[name] is an 72 y.o. male.   Chief Complaint:loss of vision left eye HPI: dislocated intraocular lens left eye  Past Medical History  Diagnosis Date  . Anxiety   . Depression   . GERD (gastroesophageal reflux disease)   . Hyperlipidemia   . Hypertension   . BPH (benign prostatic hyperplasia)     Past Surgical History  Procedure Laterality Date  . Cholecystectomy    . Nasal sinus surgery    . Septoplasty    . Tonsillectomy    . Teeth extracted      '08  . Cataract with iol od    . Cataract with iol os- complications requiring  laser therapy and drops      Family History  Problem Relation Age of Onset  . Hyperlipidemia Mother   . Hypertension Mother   . Cancer Father     lung cancer   Social History:  reports that he has never smoked. He has never used smokeless tobacco. He reports that he does not drink alcohol or use illicit drugs.  Allergies: No Known Allergies  No prescriptions prior to admission    Review of systems otherwise negative  There were no vitals taken for this visit.  Physical exam: Mental status: oriented x3. Eyes: See eye exam associated with this date of surgery in media tab.  Scanned in by scanning center Ears, Nose, Throat: within normal limits Neck: Within Normal limits General: within normal limits Chest: Within normal limits Breast: deferred Heart: Within normal limits Abdomen: Within normal limits GU: deferred Extremities: within normal limits Skin: within normal limits  Assessment/Plan Dislocated intraocular lens  Left eye  Plan: To [facility] for Pars plana vitrectomy with removal of intraocular lens from the vitreous and placement of a secondary intraocular lens left eye  Sherrie George 12/01/2012, 7:34 AM

## 2012-12-13 ENCOUNTER — Encounter (HOSPITAL_COMMUNITY): Payer: Self-pay

## 2012-12-22 ENCOUNTER — Encounter (HOSPITAL_COMMUNITY): Payer: Self-pay | Admitting: *Deleted

## 2012-12-25 MED ORDER — PHENYLEPHRINE HCL 2.5 % OP SOLN
1.0000 [drp] | OPHTHALMIC | Status: AC | PRN
  Administered 2012-12-26 (×3): 1 [drp] via OPHTHALMIC
  Filled 2012-12-25: qty 3

## 2012-12-25 MED ORDER — GATIFLOXACIN 0.5 % OP SOLN
1.0000 [drp] | OPHTHALMIC | Status: AC | PRN
  Administered 2012-12-26 (×3): 1 [drp] via OPHTHALMIC
  Filled 2012-12-25: qty 2.5

## 2012-12-25 MED ORDER — TROPICAMIDE 1 % OP SOLN
1.0000 [drp] | OPHTHALMIC | Status: AC | PRN
  Administered 2012-12-26 (×3): 1 [drp] via OPHTHALMIC
  Filled 2012-12-25: qty 3

## 2012-12-25 MED ORDER — CEFAZOLIN SODIUM-DEXTROSE 2-3 GM-% IV SOLR
2.0000 g | INTRAVENOUS | Status: AC
  Administered 2012-12-26: 2 g via INTRAVENOUS
  Filled 2012-12-25: qty 50

## 2012-12-25 MED ORDER — CYCLOPENTOLATE HCL 1 % OP SOLN
1.0000 [drp] | OPHTHALMIC | Status: AC | PRN
  Administered 2012-12-26 (×3): 1 [drp] via OPHTHALMIC
  Filled 2012-12-25: qty 2

## 2012-12-26 ENCOUNTER — Encounter (INDEPENDENT_AMBULATORY_CARE_PROVIDER_SITE_OTHER): Payer: Self-pay | Admitting: Ophthalmology

## 2012-12-26 ENCOUNTER — Ambulatory Visit (HOSPITAL_COMMUNITY)
Admission: RE | Admit: 2012-12-26 | Discharge: 2012-12-27 | Disposition: A | Discharge status: Home / Self Care | Payer: Medicare Other | Source: Ambulatory Visit | Attending: Ophthalmology | Admitting: Ophthalmology

## 2012-12-26 ENCOUNTER — Encounter (HOSPITAL_COMMUNITY): Payer: Self-pay | Admitting: Anesthesiology

## 2012-12-26 ENCOUNTER — Ambulatory Visit (HOSPITAL_COMMUNITY): Payer: Medicare Other | Admitting: Anesthesiology

## 2012-12-26 ENCOUNTER — Encounter (HOSPITAL_COMMUNITY)
Admission: RE | Disposition: A | Discharge status: Home / Self Care | Payer: Self-pay | Source: Ambulatory Visit | Attending: Ophthalmology

## 2012-12-26 ENCOUNTER — Ambulatory Visit (HOSPITAL_COMMUNITY): Payer: Medicare Other

## 2012-12-26 DIAGNOSIS — H27139 Posterior dislocation of lens, unspecified eye: Secondary | ICD-10-CM

## 2012-12-26 DIAGNOSIS — H27132 Posterior dislocation of lens, left eye: Secondary | ICD-10-CM

## 2012-12-26 DIAGNOSIS — Y831 Surgical operation with implant of artificial internal device as the cause of abnormal reaction of the patient, or of later complication, without mention of misadventure at the time of the procedure: Secondary | ICD-10-CM | POA: Insufficient documentation

## 2012-12-26 DIAGNOSIS — I10 Essential (primary) hypertension: Secondary | ICD-10-CM | POA: Insufficient documentation

## 2012-12-26 DIAGNOSIS — T8529XA Other mechanical complication of intraocular lens, initial encounter: Secondary | ICD-10-CM | POA: Insufficient documentation

## 2012-12-26 DIAGNOSIS — E785 Hyperlipidemia, unspecified: Secondary | ICD-10-CM | POA: Insufficient documentation

## 2012-12-26 HISTORY — DX: Sleep apnea, unspecified: G47.30

## 2012-12-26 HISTORY — PX: PARS PLANA VITRECTOMY: SHX2166

## 2012-12-26 HISTORY — DX: Unspecified hemorrhoids: K64.9

## 2012-12-26 HISTORY — PX: OTHER SURGICAL HISTORY: SHX169

## 2012-12-26 HISTORY — DX: Unspecified osteoarthritis, unspecified site: M19.90

## 2012-12-26 LAB — BASIC METABOLIC PANEL
BUN: 14 mg/dL (ref 6–23)
Creatinine, Ser: 0.87 mg/dL (ref 0.50–1.35)
GFR calc Af Amer: >90 mL/min (ref >90)
GFR calc non Af Amer: 86 mL/min — ABNORMAL LOW (ref >90)
Glucose, Bld: 110 mg/dL — ABNORMAL HIGH (ref 70–99)

## 2012-12-26 LAB — CBC
HCT: 41.3 % (ref 39.0–52.0)
Hemoglobin: 14.4 g/dL (ref 13.0–17.0)
MCHC: 34.9 g/dL (ref 30.0–36.0)
MCV: 89 fL (ref 78.0–100.0)
RDW: 13.7 % (ref 11.5–15.5)

## 2012-12-26 SURGERY — PARS PLANA VITRECTOMY WITH 25G REMOVAL/SUTURE INTRAOCULAR LENS
Anesthesia: General | Site: Eye | Laterality: Left | Wound class: Clean

## 2012-12-26 MED ORDER — ROCURONIUM BROMIDE 100 MG/10ML IV SOLN
INTRAVENOUS | Status: DC | PRN
  Administered 2012-12-26: 50 mg via INTRAVENOUS

## 2012-12-26 MED ORDER — DEXAMETHASONE SODIUM PHOSPHATE 10 MG/ML IJ SOLN
INTRAMUSCULAR | Status: AC
  Filled 2012-12-26: qty 1

## 2012-12-26 MED ORDER — BUPIVACAINE HCL (PF) 0.75 % IJ SOLN
INTRAMUSCULAR | Status: AC
  Filled 2012-12-26: qty 10

## 2012-12-26 MED ORDER — ACETAZOLAMIDE SODIUM 500 MG IJ SOLR
500.0000 mg | Freq: Once | INTRAMUSCULAR | Status: AC
  Administered 2012-12-27: 500 mg via INTRAVENOUS
  Filled 2012-12-26: qty 500

## 2012-12-26 MED ORDER — FINASTERIDE 5 MG PO TABS
5.0000 mg | ORAL_TABLET | Freq: Every day | ORAL | Status: DC
  Filled 2012-12-26 (×2): qty 1

## 2012-12-26 MED ORDER — PHENYLEPHRINE HCL 10 MG/ML IJ SOLN
INTRAMUSCULAR | Status: DC | PRN
  Administered 2012-12-26 (×2): 40 ug via INTRAVENOUS

## 2012-12-26 MED ORDER — VITAMIN D3 25 MCG (1000 UNIT) PO TABS
1000.0000 [IU] | ORAL_TABLET | Freq: Every day | ORAL | Status: DC
  Administered 2012-12-26: 1000 [IU] via ORAL
  Filled 2012-12-26 (×2): qty 1

## 2012-12-26 MED ORDER — SODIUM CHLORIDE 0.9 % IV SOLN
INTRAVENOUS | Status: DC
  Administered 2012-12-26 (×2): via INTRAVENOUS

## 2012-12-26 MED ORDER — ONDANSETRON HCL 4 MG/2ML IJ SOLN: 4.0000 mg | Freq: Four times a day (QID) | INTRAMUSCULAR | Status: DC | PRN

## 2012-12-26 MED ORDER — MORPHINE SULFATE 2 MG/ML IJ SOLN: 1.0000 mg | INTRAMUSCULAR | Status: DC | PRN

## 2012-12-26 MED ORDER — MAGNESIUM HYDROXIDE 400 MG/5ML PO SUSP: 15.0000 mL | Freq: Four times a day (QID) | ORAL | Status: DC | PRN

## 2012-12-26 MED ORDER — PROPOFOL 10 MG/ML IV BOLUS
INTRAVENOUS | Status: DC | PRN
  Administered 2012-12-26: 100 mg via INTRAVENOUS

## 2012-12-26 MED ORDER — ALPRAZOLAM 0.5 MG PO TABS: 0.5000 mg | ORAL_TABLET | Freq: Every evening | ORAL | Status: DC | PRN

## 2012-12-26 MED ORDER — EPINEPHRINE HCL 1 MG/ML IJ SOLN
INTRAMUSCULAR | Status: AC
  Filled 2012-12-26: qty 1

## 2012-12-26 MED ORDER — POLYMYXIN B SULFATE 500000 UNITS IJ SOLR
INTRAMUSCULAR | Status: AC
  Filled 2012-12-26: qty 1

## 2012-12-26 MED ORDER — DOCUSATE SODIUM 100 MG PO CAPS
100.0000 mg | ORAL_CAPSULE | Freq: Two times a day (BID) | ORAL | Status: DC
  Administered 2012-12-26: 100 mg via ORAL
  Filled 2012-12-26: qty 1

## 2012-12-26 MED ORDER — SODIUM HYALURONATE 10 MG/ML IO SOLN
INTRAOCULAR | Status: AC
  Filled 2012-12-26: qty 0.85

## 2012-12-26 MED ORDER — LIDOCAINE HCL (CARDIAC) 20 MG/ML IV SOLN
INTRAVENOUS | Status: DC | PRN
  Administered 2012-12-26: 80 mg via INTRAVENOUS

## 2012-12-26 MED ORDER — PREDNISOLONE ACETATE 1 % OP SUSP
1.0000 [drp] | Freq: Four times a day (QID) | OPHTHALMIC | Status: DC
  Filled 2012-12-26: qty 1

## 2012-12-26 MED ORDER — LATANOPROST 0.005 % OP SOLN
1.0000 [drp] | Freq: Every day | OPHTHALMIC | Status: DC
  Filled 2012-12-26: qty 2.5

## 2012-12-26 MED ORDER — PROVISC 10 MG/ML IO SOLN
INTRAOCULAR | Status: DC | PRN
  Administered 2012-12-26: 8.5 mg via INTRAOCULAR

## 2012-12-26 MED ORDER — GENTAMICIN SULFATE 40 MG/ML IJ SOLN
INTRAMUSCULAR | Status: AC
  Filled 2012-12-26: qty 2

## 2012-12-26 MED ORDER — MUPIROCIN 2 % EX OINT: TOPICAL_OINTMENT | Freq: Two times a day (BID) | CUTANEOUS | Status: DC

## 2012-12-26 MED ORDER — EPHEDRINE SULFATE 50 MG/ML IJ SOLN
INTRAMUSCULAR | Status: DC | PRN
  Administered 2012-12-26 (×2): 5 mg via INTRAVENOUS

## 2012-12-26 MED ORDER — EPINEPHRINE HCL 1 MG/ML IJ SOLN
INTRAOCULAR | Status: DC | PRN
  Administered 2012-12-26: 12:00:00

## 2012-12-26 MED ORDER — SODIUM CHLORIDE 0.9 % IJ SOLN
INTRAMUSCULAR | Status: DC | PRN
  Administered 2012-12-26: 12:00:00

## 2012-12-26 MED ORDER — BRIMONIDINE TARTRATE 0.2 % OP SOLN
1.0000 [drp] | Freq: Two times a day (BID) | OPHTHALMIC | Status: DC
  Filled 2012-12-26: qty 5

## 2012-12-26 MED ORDER — HYDROCODONE-ACETAMINOPHEN 5-325 MG PO TABS
1.0000 | ORAL_TABLET | ORAL | Status: DC | PRN
  Administered 2012-12-26: 1 via ORAL
  Filled 2012-12-26: qty 1

## 2012-12-26 MED ORDER — NEOSTIGMINE METHYLSULFATE 1 MG/ML IJ SOLN
INTRAMUSCULAR | Status: DC | PRN
  Administered 2012-12-26: 5 mg via INTRAVENOUS

## 2012-12-26 MED ORDER — HEMOSTATIC AGENTS (NO CHARGE) OPTIME
TOPICAL | Status: DC | PRN
  Administered 2012-12-26: 1 via TOPICAL

## 2012-12-26 MED ORDER — LISINOPRIL 5 MG PO TABS
5.0000 mg | ORAL_TABLET | Freq: Every day | ORAL | Status: DC
  Administered 2012-12-26: 5 mg via ORAL
  Filled 2012-12-26 (×2): qty 1

## 2012-12-26 MED ORDER — BACITRACIN-POLYMYXIN B 500-10000 UNIT/GM OP OINT
TOPICAL_OINTMENT | OPHTHALMIC | Status: AC
  Filled 2012-12-26: qty 3.5

## 2012-12-26 MED ORDER — BUPIVACAINE HCL (PF) 0.75 % IJ SOLN
INTRAMUSCULAR | Status: DC | PRN
  Administered 2012-12-26: 10 mL

## 2012-12-26 MED ORDER — AMLODIPINE BESYLATE 5 MG PO TABS
5.0000 mg | ORAL_TABLET | Freq: Every day | ORAL | Status: DC
  Filled 2012-12-26: qty 1

## 2012-12-26 MED ORDER — ATROPINE SULFATE 1 % OP SOLN
OPHTHALMIC | Status: AC
  Filled 2012-12-26: qty 2

## 2012-12-26 MED ORDER — TEMAZEPAM 15 MG PO CAPS: 15.0000 mg | ORAL_CAPSULE | Freq: Every evening | ORAL | Status: DC | PRN

## 2012-12-26 MED ORDER — MIDAZOLAM HCL 5 MG/5ML IJ SOLN
INTRAMUSCULAR | Status: DC | PRN
  Administered 2012-12-26: 2 mg via INTRAVENOUS

## 2012-12-26 MED ORDER — LATANOPROST 0.005 % OP SOLN: 1.0000 [drp] | Freq: Two times a day (BID) | OPHTHALMIC | Status: DC

## 2012-12-26 MED ORDER — VENLAFAXINE HCL ER 150 MG PO CP24
150.0000 mg | ORAL_CAPSULE | Freq: Every day | ORAL | Status: DC
  Administered 2012-12-26: 150 mg via ORAL
  Filled 2012-12-26 (×2): qty 1

## 2012-12-26 MED ORDER — MUPIROCIN 2 % EX OINT
TOPICAL_OINTMENT | CUTANEOUS | Status: AC
  Administered 2012-12-26: 1 via NASAL
  Filled 2012-12-26: qty 22

## 2012-12-26 MED ORDER — HYDROMORPHONE HCL PF 1 MG/ML IJ SOLN: 0.2500 mg | INTRAMUSCULAR | Status: DC | PRN

## 2012-12-26 MED ORDER — BSS PLUS IO SOLN
INTRAOCULAR | Status: AC
  Filled 2012-12-26: qty 500

## 2012-12-26 MED ORDER — BACITRACIN-POLYMYXIN B 500-10000 UNIT/GM OP OINT
TOPICAL_OINTMENT | OPHTHALMIC | Status: DC | PRN
  Administered 2012-12-26: 1 via OPHTHALMIC

## 2012-12-26 MED ORDER — MINERAL OIL LIGHT 100 % EX OIL
TOPICAL_OIL | CUTANEOUS | Status: AC
  Filled 2012-12-26: qty 25

## 2012-12-26 MED ORDER — DEXAMETHASONE SODIUM PHOSPHATE 10 MG/ML IJ SOLN
INTRAMUSCULAR | Status: DC | PRN
  Administered 2012-12-26: 10 mg

## 2012-12-26 MED ORDER — GLYCOPYRROLATE 0.2 MG/ML IJ SOLN
INTRAMUSCULAR | Status: DC | PRN
  Administered 2012-12-26: 0.1 mg via INTRAVENOUS
  Administered 2012-12-26: 0.6 mg via INTRAVENOUS

## 2012-12-26 MED ORDER — TETRACAINE HCL 0.5 % OP SOLN
2.0000 [drp] | Freq: Once | OPHTHALMIC | Status: DC
  Filled 2012-12-26: qty 2

## 2012-12-26 MED ORDER — ONDANSETRON HCL 4 MG/2ML IJ SOLN
INTRAMUSCULAR | Status: DC | PRN
  Administered 2012-12-26: 4 mg via INTRAVENOUS

## 2012-12-26 MED ORDER — GATIFLOXACIN 0.5 % OP SOLN
1.0000 [drp] | Freq: Four times a day (QID) | OPHTHALMIC | Status: DC
  Filled 2012-12-26 (×2): qty 2.5

## 2012-12-26 MED ORDER — SODIUM CHLORIDE 0.45 % IV SOLN
INTRAVENOUS | Status: DC
  Administered 2012-12-26: 15:00:00 via INTRAVENOUS

## 2012-12-26 MED ORDER — ACETAMINOPHEN 325 MG PO TABS: 325.0000 mg | ORAL_TABLET | ORAL | Status: DC | PRN

## 2012-12-26 MED ORDER — FENTANYL CITRATE 0.05 MG/ML IJ SOLN
INTRAMUSCULAR | Status: DC | PRN
  Administered 2012-12-26: 150 ug via INTRAVENOUS

## 2012-12-26 MED ORDER — BACITRACIN-POLYMYXIN B 500-10000 UNIT/GM OP OINT
1.0000 "application " | TOPICAL_OINTMENT | Freq: Four times a day (QID) | OPHTHALMIC | Status: DC
  Filled 2012-12-26: qty 3.5

## 2012-12-26 MED ORDER — ONDANSETRON HCL 4 MG/2ML IJ SOLN: 4.0000 mg | Freq: Once | INTRAMUSCULAR | Status: DC | PRN

## 2012-12-26 MED ORDER — DOXAZOSIN MESYLATE 2 MG PO TABS
2.0000 mg | ORAL_TABLET | Freq: Every day | ORAL | Status: DC
  Filled 2012-12-26: qty 1

## 2012-12-26 MED ORDER — BSS IO SOLN
INTRAOCULAR | Status: DC | PRN
  Administered 2012-12-26: 15 mL via INTRAOCULAR

## 2012-12-26 SURGICAL SUPPLY — 71 items
ACCESSORY FRAGMATOME (MISCELLANEOUS) IMPLANT
APL SRG 3 HI ABS STRL LF PLS (MISCELLANEOUS)
APPLICATOR DR MATTHEWS STRL (MISCELLANEOUS) IMPLANT
BALL CTTN LRG ABS STRL LF (GAUZE/BANDAGES/DRESSINGS) ×3
BLADE EYE CATARACT 19 1.4 BEAV (BLADE) IMPLANT
BLADE KERATOME 2.75 (BLADE) ×2 IMPLANT
CANNULA FLEX TIP 25G (CANNULA) IMPLANT
CLOTH BEACON ORANGE TIMEOUT ST (SAFETY) ×2 IMPLANT
CORDS BIPOLAR (ELECTRODE) IMPLANT
COTTONBALL LRG STERILE PKG (GAUZE/BANDAGES/DRESSINGS) ×6 IMPLANT
COVER MAYO STAND STRL (DRAPES) IMPLANT
DRAPE INCISE 51X51 W/FILM STRL (DRAPES) IMPLANT
DRAPE OPHTHALMIC 77X100 STRL (CUSTOM PROCEDURE TRAY) ×2 IMPLANT
FILTER BLUE MILLIPORE (MISCELLANEOUS) IMPLANT
FORCEPS ECKARDT ILM 25G SERR (OPHTHALMIC RELATED) IMPLANT
FORCEPS HORIZONTAL 25G DISP (OPHTHALMIC RELATED) IMPLANT
GAS OPHTHALMIC (MISCELLANEOUS) IMPLANT
GLOVE SS BIOGEL STRL SZ 6.5 (GLOVE) ×2 IMPLANT
GLOVE SS BIOGEL STRL SZ 7 (GLOVE) ×1 IMPLANT
GLOVE SUPERSENSE BIOGEL SZ 6.5 (GLOVE) ×1
GLOVE SUPERSENSE BIOGEL SZ 7 (GLOVE) ×1
GLOVE SURG 8.5 LATEX PF (GLOVE) ×2 IMPLANT
GLOVE SURG SS PI 6.5 STRL IVOR (GLOVE) ×1 IMPLANT
GOWN STRL NON-REIN LRG LVL3 (GOWN DISPOSABLE) ×7 IMPLANT
ILLUMINATOR CHOW PICK 25GA (MISCELLANEOUS) ×2 IMPLANT
KIT BASIN OR (CUSTOM PROCEDURE TRAY) ×2 IMPLANT
KIT ROOM TURNOVER OR (KITS) ×2 IMPLANT
KNIFE CRESCENT 1.75 EDGEAHEAD (BLADE) IMPLANT
LENS IOL POST 1PIECE DIOP 10.0 (Intraocular Lens) ×1 IMPLANT
MARKER SKIN DUAL TIP RULER LAB (MISCELLANEOUS) IMPLANT
MASK EYE SHIELD (GAUZE/BANDAGES/DRESSINGS) ×1 IMPLANT
MICROPICK 25G (MISCELLANEOUS)
NDL 18GX1X1/2 (RX/OR ONLY) (NEEDLE) ×1 IMPLANT
NDL 25GX 5/8IN NON SAFETY (NEEDLE) ×1 IMPLANT
NDL 27GX1/2 REG BEVEL ECLIP (NEEDLE) IMPLANT
NDL FILTER BLUNT 18X1 1/2 (NEEDLE) ×1 IMPLANT
NDL HYPO 30X.5 LL (NEEDLE) ×1 IMPLANT
NEEDLE 18GX1X1/2 (RX/OR ONLY) (NEEDLE) ×2 IMPLANT
NEEDLE 25GX 5/8IN NON SAFETY (NEEDLE) ×2 IMPLANT
NEEDLE 27GX1/2 REG BEVEL ECLIP (NEEDLE) IMPLANT
NEEDLE FILTER BLUNT 18X 1/2SAF (NEEDLE) ×1
NEEDLE FILTER BLUNT 18X1 1/2 (NEEDLE) ×1 IMPLANT
NEEDLE HYPO 30X.5 LL (NEEDLE) ×2 IMPLANT
NS IRRIG 1000ML POUR BTL (IV SOLUTION) ×2 IMPLANT
PACK VITRECTOMY CUSTOM (CUSTOM PROCEDURE TRAY) ×2 IMPLANT
PAD ARMBOARD 7.5X6 YLW CONV (MISCELLANEOUS) ×4 IMPLANT
PAD EYE OVAL STERILE LF (GAUZE/BANDAGES/DRESSINGS) ×1 IMPLANT
PAK VITRECTOMY PIK 25 GA (OPHTHALMIC RELATED) ×2 IMPLANT
PENCIL BIPOLAR 25GA STR DISP (OPHTHALMIC RELATED) IMPLANT
PICK MICROPICK 25G (MISCELLANEOUS) IMPLANT
PROBE DIRECTIONAL LASER (MISCELLANEOUS) IMPLANT
PROBE VITREOUS 25+ ACCURUS (MISCELLANEOUS) IMPLANT
ROLLS DENTAL (MISCELLANEOUS) ×4 IMPLANT
SCRAPER DIAMOND 25GA (OPHTHALMIC RELATED) IMPLANT
SPONGE SURGIFOAM ABS GEL 12-7 (HEMOSTASIS) ×2 IMPLANT
STOPCOCK 4 WAY LG BORE MALE ST (IV SETS) IMPLANT
SUT CHROMIC 7 0 TG140 8 (SUTURE) ×2 IMPLANT
SUT ETHILON 10 0 CS140 6 (SUTURE) ×2 IMPLANT
SUT ETHILON 9 0 BV100 4 (SUTURE) IMPLANT
SUT POLY NON ABSORB 10-0 8 STR (SUTURE) ×4 IMPLANT
SUT SILK 4 0 RB 1 (SUTURE) IMPLANT
SYR 20CC LL (SYRINGE) ×2 IMPLANT
SYR 5ML LL (SYRINGE) IMPLANT
SYR BULB 3OZ (MISCELLANEOUS) ×2 IMPLANT
SYR TB 1ML LUER SLIP (SYRINGE) ×2 IMPLANT
SYRINGE 10CC LL (SYRINGE) IMPLANT
TAPE SURG TRANSPORE 1 IN (GAUZE/BANDAGES/DRESSINGS) ×1 IMPLANT
TAPE SURGICAL TRANSPORE 1 IN (GAUZE/BANDAGES/DRESSINGS) ×1
TOWEL OR 17X24 6PK STRL BLUE (TOWEL DISPOSABLE) ×6 IMPLANT
TROCAR CANNULA 25GA (CANNULA) IMPLANT
WATER STERILE IRR 1000ML POUR (IV SOLUTION) ×2 IMPLANT

## 2012-12-26 NOTE — Transfer of Care (Signed)
Immediate Anesthesia Transfer of Care Note  Patient: [name]  Procedure(s) Performed: Procedure(s): PARS PLANA VITRECTOMY WITH 25G REMOVAL/SUTURE INTRAOCULAR LENS (Left)  Patient Location: PACU  Anesthesia Type:General  Level of Consciousness: awake, alert  and oriented  Airway & Oxygen Therapy: Patient connected to face mask oxygen  Post-op Assessment: Report given to PACU RN  Post vital signs: stable  Complications: No apparent anesthesia complications

## 2012-12-26 NOTE — Brief Op Note (Signed)
Brief Operative note   Preoperative diagnosis:  Pre-Op Diagnosis Codes:    * Posterior dislocation of lens [379.34] Postoperative diagnosis  Post-Op Diagnosis Codes:    * Posterior dislocation of lens [379.34]  Procedures: Pars plana vitrectomy, laser, remove IOL from Vitreous and placement of secondary IOL  Left eye  Surgeon:  Sherrie George, MD...  Assistant:  Rosalie Doctor SA   Anesthesia: General  Specimen: none  Estimated blood loss:  1cc  Complications: none  Patient sent to PACU in good condition  Composed by Sherrie George MD  Dictation number: [phone]

## 2012-12-26 NOTE — Progress Notes (Signed)
Lunch relief by Lorelei Pont RN

## 2012-12-26 NOTE — Anesthesia Postprocedure Evaluation (Signed)
  Anesthesia Post-op Note  Patient: [name]  Procedure(s) Performed: Procedure(s): PARS PLANA VITRECTOMY WITH 25G REMOVAL/SUTURE INTRAOCULAR LENS (Left)  Patient Location: PACU  Anesthesia Type:General  Level of Consciousness: awake, alert  and oriented  Airway and Oxygen Therapy: Patient Spontanous Breathing and Patient connected to nasal cannula oxygen  Post-op Pain: mild  Post-op Assessment: Post-op Vital signs reviewed, Patient's Cardiovascular Status Stable, Respiratory Function Stable, Patent Airway and Pain level controlled  Post-op Vital Signs: stable  Complications: No apparent anesthesia complications

## 2012-12-26 NOTE — Anesthesia Preprocedure Evaluation (Addendum)
Anesthesia Evaluation  Patient identified by MRN, date of birth, ID band Patient awake    Reviewed: Allergy & Precautions, H&P , NPO status , Patient's Chart, lab work & pertinent test results, reviewed documented beta blocker date and time   Airway Mallampati: I TM Distance: >3 FB Neck ROM: full    Dental  (+) Edentulous Upper, Edentulous Lower and Dental Advisory Given   Pulmonary sleep apnea and Continuous Positive Airway Pressure Ventilation ,  breath sounds clear to auscultation        Cardiovascular hypertension, Pt. on medications Rhythm:regular Rate:Normal     Neuro/Psych Anxiety Depression    GI/Hepatic GERD-  Medicated,  Endo/Other    Renal/GU      Musculoskeletal   Abdominal (+)  Abdomen: soft. Bowel sounds: normal.  Peds  Hematology   Anesthesia Other Findings   Reproductive/Obstetrics                         Anesthesia Physical Anesthesia Plan  ASA: III  Anesthesia Plan: General   Post-op Pain Management:    Induction: Intravenous  Airway Management Planned: Oral ETT  Additional Equipment:   Intra-op Plan:   Post-operative Plan: Extubation in OR  Informed Consent: I have reviewed the patients History and Physical, chart, labs and discussed the procedure including the risks, benefits and alternatives for the proposed anesthesia with the patient or authorized representative who has indicated his/her understanding and acceptance.     Plan Discussed with: CRNA, Anesthesiologist and Surgeon  Anesthesia Plan Comments:         Anesthesia Quick Evaluation

## 2012-12-26 NOTE — Preoperative (Signed)
Beta Blockers   Reason not to administer Beta Blockers:Not Applicable 

## 2012-12-26 NOTE — H&P (Signed)
I examined the patient today and there is no change in the medical status 

## 2012-12-26 NOTE — Anesthesia Procedure Notes (Signed)
Procedure Name: Intubation Date/Time: 12/26/2012 12:10 PM Performed by: Ellin Goodie Pre-anesthesia Checklist: Patient identified, Emergency Drugs available, Suction available, Patient being monitored and Timeout performed Patient Re-evaluated:Patient Re-evaluated prior to inductionOxygen Delivery Method: Circle system utilized Preoxygenation: Pre-oxygenation with 100% oxygen Intubation Type: IV induction Ventilation: Mask ventilation without difficulty Laryngoscope Size: 4 and Mac Grade View: Grade I Tube type: Oral Tube size: 7.5 mm Number of attempts: 1 Airway Equipment and Method: Stylet Placement Confirmation: ETT inserted through vocal cords under direct vision,  positive ETCO2 and breath sounds checked- equal and bilateral Secured at: 22 cm Tube secured with: Tape Dental Injury: Teeth and Oropharynx as per pre-operative assessment

## 2012-12-27 MED ORDER — BACITRACIN-POLYMYXIN B 500-10000 UNIT/GM OP OINT: 1.0000 "application " | TOPICAL_OINTMENT | Freq: Three times a day (TID) | OPHTHALMIC | Status: DC

## 2012-12-27 MED ORDER — PREDNISOLONE ACETATE 1 % OP SUSP: 1.0000 [drp] | Freq: Four times a day (QID) | OPHTHALMIC | Status: DC

## 2012-12-27 MED ORDER — GATIFLOXACIN 0.5 % OP SOLN: 1.0000 [drp] | Freq: Four times a day (QID) | OPHTHALMIC | Status: DC

## 2012-12-27 NOTE — Progress Notes (Signed)
DC home with wife, verbally understood DC instructions, no questions asked. 

## 2012-12-27 NOTE — Progress Notes (Signed)
12/27/2012, 7:00 AM  Mental Status:  Awake, Alert, Oriented  Anterior segment: Cornea  Clear    Anterior Chamber Clear    Lens:    IOL  Intra Ocular Pressure 11 mmHg with Tonopen  Vitreous: Clear 30%gas bubble  Retina:  Attached Good laser reaction   Impression: Excellent result Retina attached   Final Diagnosis: Principal Problem:   Dislocated IOL (intraocular lens), posterior   Plan: start post operative eye drops.  Discharge to home.  Give post operative instructions  Sherrie George 12/27/2012, 7:00 AM

## 2012-12-27 NOTE — Op Note (Signed)
[name], GAINS NO.:  [id number]  MEDICAL RECORD NO.:  [id number]  LOCATION:  [id number]                        FACILITY:  [facility]  PHYSICIAN:  Beulah Gandy. Ashley Royalty, M.D. DATE OF BIRTH:  1941/02/28  DATE OF PROCEDURE:  12/26/2012 DATE OF DISCHARGE:                              OPERATIVE REPORT   DIAGNOSIS:  Dislocated intraocular lens into the vitreous, left eye.  PROCEDURES: 1. Pars plana vitrectomy. 2. Removal of intraocular lens. 3. Retinal photocoagulation. 4. Placement of secondary intraocular lens, all in the left eye.  SURGEON:  Beulah Gandy. Ashley Royalty, MD  ASSISTANT:  Rosalie Doctor, SA  ANESTHESIA:  General.  DESCRIPTION OF PROCEDURE:  Usual prep and drape, the indirect ophthalmoscope laser was moved into place.  Inspection of the retinal periphery revealed weak areas in the far periphery.  958 burns were placed around the retinal periphery with power of 1000 mW 1000 microns each and 0.1 seconds each.  Attention was carried to the pars plana where conjunctival peritomy from 8 o'clock to 4 o'clock was performed. Half thickness scleral flaps were raised at 3 and 9 o'clock in anticipation of IOL suture.  25-gauge trocars placed at 10, 2, and 4 o'clock, infusion at 4 o'clock.  Three-layered corneoscleral wound was performed from between 10 o'clock and 2 o'clock.  Pars plana vitrectomy was begun in the pupillary axis.  Vitreous was removed from its attachments to the intraocular lens in the anterior vitreous.  The lens was passed into the anterior chamber and removed with the Sinskey hook through the corneoscleral wound.  Additional vitrectomy was carried out, removing all vitreous from its attachments to the inside of the wound. The vitrectomy is carried posteriorly where additional vitreous was removed down to the macular surface.  The mid periphery was treated with vitrectomy and all opacities were removed.  The vitrectomy was carried in the far periphery  where the  super wide BIOM viewing system was moved into place.  Provisc was placed on the corneal surface.  This far periphery was inspected and the vitreous opacities were removed. Attention was carried back to the anterior segment where two 10-0 Prolene sutures were passed beneath the flaps behind the iris and anterior to the capsular remnants from 3 o'clock to 9 o'clock.  Once these 2 Prolenes were passed, the intraocular lens was brought onto the field.  Intraocular lens made by [employer], model [id number], power 10.0 D, length 12.5 mm, optic 7.0 mm, serial #[id number], expiration date April 2017.  The lens was brought on to the field, inspected, and cleaned.  The Prolene sutures were pulled through the corneoscleral wound and attached to the eyelids of the lens.  The lens was passed through the corneoscleral wound into the anterior chamber, then in the posterior chamber.  It was dialed into place and the Prolene sutures were drawn securely and knotted beneath the scleral flaps.  The cornea was closed with 6 interrupted 10-0 nylon sutures.  Additional vitrectomy was carried out at this point, removing debris and pigment from the vitreous cavity in the retinal surface.  A 30% gas-fluid exchange was carried out.  The instruments were removed from the eye and  the trocars were removed from the eye.  The conjunctiva was closed with 7-0 chromic suture.  The corneal wound was totally covered with conjunctiva.  Polymyxin and gentamicin were irrigated into tenon space. Marcaine was injected around the globe for postop pain.  Decadron 10 mg was injected to the lower subconjunctival space.  Closing pressure was 10 with a Barraquer tonometer.  Complications none.  Duration 1 hour 30 minutes.  Polysporin ophthalmic ointment, a patch and shield were placed.  The patient was awakened, taken to the recovery room in satisfactory condition.     Beulah Gandy. Ashley Royalty, M.D.     JDM/MEDQ   D:  12/26/2012  T:  12/27/2012  Job:  [id number]

## 2012-12-27 NOTE — Discharge Summary (Signed)
Discharge summary not needed on [facility] patients per medical records. 

## 2012-12-28 ENCOUNTER — Encounter (HOSPITAL_COMMUNITY): Payer: Self-pay | Admitting: Ophthalmology

## 2013-01-02 ENCOUNTER — Encounter (INDEPENDENT_AMBULATORY_CARE_PROVIDER_SITE_OTHER): Payer: Medicare Other | Admitting: Ophthalmology

## 2013-01-02 DIAGNOSIS — H2700 Aphakia, unspecified eye: Secondary | ICD-10-CM

## 2013-01-22 ENCOUNTER — Other Ambulatory Visit: Payer: Self-pay

## 2013-01-22 MED ORDER — VENLAFAXINE HCL ER 150 MG PO CP24: 150.0000 mg | ORAL_CAPSULE | Freq: Every day | ORAL | Status: DC

## 2013-02-05 ENCOUNTER — Ambulatory Visit (INDEPENDENT_AMBULATORY_CARE_PROVIDER_SITE_OTHER): Payer: Medicare Other | Admitting: Ophthalmology

## 2013-02-07 ENCOUNTER — Encounter (INDEPENDENT_AMBULATORY_CARE_PROVIDER_SITE_OTHER): Payer: Medicare Other | Admitting: Ophthalmology

## 2013-02-07 DIAGNOSIS — H2700 Aphakia, unspecified eye: Secondary | ICD-10-CM

## 2013-03-15 ENCOUNTER — Encounter (INDEPENDENT_AMBULATORY_CARE_PROVIDER_SITE_OTHER): Payer: Medicare Other | Admitting: Ophthalmology

## 2013-03-15 DIAGNOSIS — H43819 Vitreous degeneration, unspecified eye: Secondary | ICD-10-CM

## 2013-03-15 DIAGNOSIS — H2700 Aphakia, unspecified eye: Secondary | ICD-10-CM

## 2013-03-15 DIAGNOSIS — H35039 Hypertensive retinopathy, unspecified eye: Secondary | ICD-10-CM

## 2013-03-15 DIAGNOSIS — I10 Essential (primary) hypertension: Secondary | ICD-10-CM

## 2013-03-31 ENCOUNTER — Other Ambulatory Visit: Payer: Self-pay | Admitting: Internal Medicine

## 2013-04-02 ENCOUNTER — Telehealth: Payer: Self-pay | Admitting: Internal Medicine

## 2013-04-02 MED ORDER — ALPRAZOLAM 0.5 MG PO TABS: 0.5000 mg | ORAL_TABLET | Freq: Four times a day (QID) | ORAL | Status: DC | PRN

## 2013-04-02 NOTE — Telephone Encounter (Signed)
rx called to pharmacy; patient's wife notified

## 2013-04-02 NOTE — Telephone Encounter (Signed)
Ok to refill alprazolam 

## 2013-04-02 NOTE — Telephone Encounter (Signed)
Pt needs a new rx Alprazolam to go to [facility].

## 2013-04-15 ENCOUNTER — Other Ambulatory Visit: Payer: Self-pay | Admitting: Internal Medicine

## 2013-04-18 ENCOUNTER — Encounter (INDEPENDENT_AMBULATORY_CARE_PROVIDER_SITE_OTHER): Payer: Medicare Other | Admitting: Ophthalmology

## 2013-04-18 DIAGNOSIS — H26499 Other secondary cataract, unspecified eye: Secondary | ICD-10-CM

## 2013-04-18 DIAGNOSIS — I10 Essential (primary) hypertension: Secondary | ICD-10-CM

## 2013-04-18 DIAGNOSIS — H43819 Vitreous degeneration, unspecified eye: Secondary | ICD-10-CM

## 2013-04-18 DIAGNOSIS — H35039 Hypertensive retinopathy, unspecified eye: Secondary | ICD-10-CM

## 2013-04-18 DIAGNOSIS — H35379 Puckering of macula, unspecified eye: Secondary | ICD-10-CM

## 2013-04-27 ENCOUNTER — Ambulatory Visit (INDEPENDENT_AMBULATORY_CARE_PROVIDER_SITE_OTHER): Payer: Medicare Other | Admitting: Ophthalmology

## 2013-04-27 DIAGNOSIS — H2700 Aphakia, unspecified eye: Secondary | ICD-10-CM

## 2013-05-17 ENCOUNTER — Other Ambulatory Visit: Payer: Self-pay | Admitting: Internal Medicine

## 2013-07-24 ENCOUNTER — Other Ambulatory Visit: Payer: Self-pay | Admitting: Internal Medicine

## 2013-08-11 ENCOUNTER — Other Ambulatory Visit: Payer: Self-pay | Admitting: Internal Medicine

## 2013-08-21 ENCOUNTER — Telehealth: Payer: Self-pay | Admitting: *Deleted

## 2013-08-21 NOTE — Telephone Encounter (Signed)
May provide samples if we have them. If not let me know and we can try to find a substitute sample for him.

## 2013-08-21 NOTE — Telephone Encounter (Signed)
Ste[facility] pts wife called states pts Crestor Rx is going to cost over $200 because is in the [facility]  Please advise

## 2013-08-22 NOTE — Telephone Encounter (Signed)
Substitute Vytorin 10/40 once a day,m # 28

## 2013-08-22 NOTE — Telephone Encounter (Signed)
Spoke with pt advised Crestor substituted for Vytorin and samples ready for pick up

## 2013-08-22 NOTE — Telephone Encounter (Signed)
No samples available at this time.  Please advise

## 2013-08-31 ENCOUNTER — Ambulatory Visit (INDEPENDENT_AMBULATORY_CARE_PROVIDER_SITE_OTHER): Payer: Medicare Other | Admitting: Nurse Practitioner

## 2013-08-31 ENCOUNTER — Encounter: Payer: Medicare Other | Admitting: Nurse Practitioner

## 2013-08-31 ENCOUNTER — Encounter: Payer: Self-pay | Admitting: Nurse Practitioner

## 2013-08-31 VITALS — BP 134/84 | HR 67 | Temp 97.5°F | Ht 70.0 in | Wt 271.2 lb

## 2013-08-31 DIAGNOSIS — Z0000 Encounter for general adult medical examination without abnormal findings: Secondary | ICD-10-CM

## 2013-08-31 NOTE — Progress Notes (Signed)
Subjective:     [name] is a 72 y.o. male and is here for a Medicare Annual Wellness Visit. Mr Kneisel retired in August. He is enjoying working in yard. He recently had bilateral cataract surgery and was referred to retina specialist for the L eye. He has no complaints today.   History   Social History  . Marital Status: Married    Spouse Name: brenda    Number of Children: 3  . Years of Education: 18   Occupational History  . courier    Social History Main Topics  . Smoking status: Former Smoker -- 10 years  . Smokeless tobacco: Never Used     Comment: quit 15 years ago  . Alcohol Use: No  . Drug Use: No  . Sexual Activity: Not Currently   Other Topics Concern  . Not on file   Social History Narrative   [employer] [place], [employer]- [employer]. Married 1966. 3 boys, - '68, '73, '74   3 grandchildren. 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 SO- medical problems.ACP -End of life: no cpr, no prolonged Ventilator support, would consider HD, no heroic life- sustaining therapy, e.g. Artificial feeding or hydration in the long term.    Retired in August 2014.   Health Maintenance  Topic Date Due  . Zostavax  08/25/2003  . Influenza Vaccine  05/11/2014  . Colonoscopy  08/26/2020  . Tetanus/tdap  09/15/2020  . Pneumococcal Polysaccharide Vaccine Age 62 And Over  Completed    The following portions of the patient's history were reviewed and updated as appropriate: allergies, current medications, past family history, past medical history, past social history, past surgical history and problem list.  Review of Systems Constitutional: negative, enjoying retirement. Planning to start silver sneakers w/the wife. Eyes: recent cataract surgery. referral to retina specialist for L eye. Using steroid drops.   Objective:    No exam performed today, medicare annual wellness visit.    Assessment & Plan:    Medicare Annual Wellness Exam: Medical & family Hx-  recent cataract surgery, seeing retina specialist for L eye. PHQ9 score-0 Fall/ home safety/ ADLs/sensory impairments-low fall risk. Sensory impairment-eyes, acute due to recent surgey, using drops in L eye BMI & VS- 38, down from 40. Planning to start silver sneakers with wife. VS WNL. BP well controlled on meds. Medical providers- eyes: Dr Randon [facility]; GI Dr Jarold Motto; urology Dr Isabel Caprice; PCP Dr Waynard Reeds Mental Status Exam- Perfect Score of 30. No deficit. 5 year plan- PCP q27mo to monitor chronic conditions & weight loss. Yearly CPE. Colo screen as recommended by GI-no notes found in chart- Will request. F/u w/ urology & opthal as indicated. Considering shingles vaccine. Risk Factors- obesity, HTN, hyperlipidemia, male over 30.            See After Visit Summary for Counseling Recommendations

## 2013-08-31 NOTE — Progress Notes (Signed)
Pre-visit discussion using our clinic review tool. No additional management support is needed unless otherwise documented below in the visit note.  

## 2013-08-31 NOTE — Patient Instructions (Signed)
Today you had a [employer] Visit. We have updated your medical history and reviewed risks for disease. Your medical plan includes 6 month visits with primary care to monitor reflux & blood pressure; yearly physicals to screen for disease and prevent illness; regular eye exams as indicated by opthalmology; see other specialist as needed-such as urology. Consider getting the shingles vaccine and yearly flu VACCINES. Continue to follow Dr Jarold Motto for colon cancer screening. I'm excited that you will be attending [employer] for many benefits of exercise. Pleasure to meet you today!

## 2013-10-29 ENCOUNTER — Other Ambulatory Visit: Payer: Self-pay | Admitting: Internal Medicine

## 2013-10-31 ENCOUNTER — Ambulatory Visit (INDEPENDENT_AMBULATORY_CARE_PROVIDER_SITE_OTHER): Payer: Medicare Other | Admitting: Ophthalmology

## 2013-10-31 DIAGNOSIS — H2700 Aphakia, unspecified eye: Secondary | ICD-10-CM

## 2013-10-31 DIAGNOSIS — I10 Essential (primary) hypertension: Secondary | ICD-10-CM

## 2013-10-31 DIAGNOSIS — H43819 Vitreous degeneration, unspecified eye: Secondary | ICD-10-CM

## 2013-10-31 DIAGNOSIS — H35039 Hypertensive retinopathy, unspecified eye: Secondary | ICD-10-CM

## 2013-10-31 DIAGNOSIS — H35379 Puckering of macula, unspecified eye: Secondary | ICD-10-CM

## 2013-11-23 ENCOUNTER — Telehealth: Payer: Self-pay | Admitting: Internal Medicine

## 2013-11-23 NOTE — Telephone Encounter (Signed)
Rec'd 2 actual pages of office note from [facility] for Dr. Linda Hedges 11/22/13. Sent up to Dr. Linda Hedges 11/23/13

## 2013-12-04 ENCOUNTER — Encounter: Payer: Self-pay | Admitting: Internal Medicine

## 2013-12-04 ENCOUNTER — Other Ambulatory Visit (INDEPENDENT_AMBULATORY_CARE_PROVIDER_SITE_OTHER): Payer: Medicare Other

## 2013-12-04 ENCOUNTER — Ambulatory Visit (INDEPENDENT_AMBULATORY_CARE_PROVIDER_SITE_OTHER): Payer: Medicare Other | Admitting: Internal Medicine

## 2013-12-04 VITALS — BP 132/86 | HR 85 | Temp 98.1°F | Ht 72.0 in | Wt 279.4 lb

## 2013-12-04 DIAGNOSIS — I10 Essential (primary) hypertension: Secondary | ICD-10-CM

## 2013-12-04 DIAGNOSIS — Z23 Encounter for immunization: Secondary | ICD-10-CM

## 2013-12-04 DIAGNOSIS — Z7189 Other specified counseling: Secondary | ICD-10-CM

## 2013-12-04 DIAGNOSIS — F411 Generalized anxiety disorder: Secondary | ICD-10-CM

## 2013-12-04 DIAGNOSIS — E785 Hyperlipidemia, unspecified: Secondary | ICD-10-CM

## 2013-12-04 DIAGNOSIS — Z0000 Encounter for general adult medical examination without abnormal findings: Secondary | ICD-10-CM

## 2013-12-04 LAB — COMPREHENSIVE METABOLIC PANEL
ALT: 20 U/L (ref 0–53)
AST: 28 U/L (ref 0–37)
Albumin: 4.2 g/dL (ref 3.5–5.2)
Alkaline Phosphatase: 67 U/L (ref 39–117)
BILIRUBIN TOTAL: 0.7 mg/dL (ref 0.3–1.2)
BUN: 16 mg/dL (ref 6–23)
CO2: 26 mEq/L (ref 19–32)
CREATININE: 0.9 mg/dL (ref 0.4–1.5)
Calcium: 9.6 mg/dL (ref 8.4–10.5)
Chloride: 105 mEq/L (ref 96–112)
GFR: 94.64 mL/min (ref >60.00)
GLUCOSE: 99 mg/dL (ref 70–99)
Potassium: 4.3 mEq/L (ref 3.5–5.1)
Sodium: 138 mEq/L (ref 135–145)
Total Protein: 7.4 g/dL (ref 6.0–8.3)

## 2013-12-04 LAB — LIPID PANEL
CHOLESTEROL: 159 mg/dL (ref 0–200)
HDL: 61 mg/dL (ref >39.00)
LDL CALC: 63 mg/dL (ref 0–99)
Total CHOL/HDL Ratio: 3
Triglycerides: 175 mg/dL — ABNORMAL HIGH (ref 0.0–149.0)
VLDL: 35 mg/dL (ref 0.0–40.0)

## 2013-12-04 MED ORDER — SIMVASTATIN 40 MG PO TABS: 40.0000 mg | ORAL_TABLET | Freq: Every day | ORAL | Status: DC

## 2013-12-04 MED ORDER — EZETIMIBE 10 MG PO TABS: 10.0000 mg | ORAL_TABLET | Freq: Every day | ORAL | Status: DC

## 2013-12-04 NOTE — Progress Notes (Signed)
Subjective:    [name], male    DOB: 1941/06/05, 73 y.o.   MRN: [id number]  HPI The patient is here for annual Medicare wellness examination and management of other chronic and acute problems.  [name] reports he has been reasonably well. He did have eye surgery about a year ago and has not had hospitalization.    The risk factors are reflected in the social history.  The roster of all physicians providing medical care to patient - is listed in the Snapshot section of the chart.  Activities of daily living:  The patient is 100% inedpendent in all ADLs: dressing, toileting, feeding as well as independent mobility  Home safety : The patient has smoke detectors in the home. Falls - none. They wear seatbelts. No firearms at home. There is no violence in the home.   There is no risks for hepatitis, STDs or HIV. There is no   history of blood transfusion. They have no travel history to infectious disease endemic areas of the world.  The patient has not seen their dentist in the last six month- has full dentures. He does need new liners. They have seen their eye doctor in the last year. They deny any hearing difficulty and have not had audiologic testing in the last year.    They do not  have excessive sun exposure. Discussed the need for sun protection: hats, long sleeves and use of sunscreen if there is significant sun exposure.   Diet: the importance of a healthy diet is discussed. They do try to have a healthy diet.  The patient has a regular exercise program: walks , 30 min duration, 3 per week.  The benefits of regular aerobic exercise were discussed.  Depression screen: there are no signs or vegative symptoms of depression- irritability, change in appetite, anhedonia, sadness/tearfullness.  Cognitive assessment: the patient manages all their financial and personal affairs and is actively engaged.   The following portions of the patient's history were reviewed  and updated as appropriate: allergies, current medications, past family history, past medical history,  past surgical history, past social history  and problem list.  Vision, hearing, body mass index were assessed and reviewed.   During the course of the visit the patient was educated and counseled about appropriate screening and preventive services including : fall prevention , diabetes screening, nutrition counseling, colorectal cancer screening, and recommended immunizations.  Past Medical History  Diagnosis Date  . Anxiety   . Depression   . GERD (gastroesophageal reflux disease)   . Hyperlipidemia   . Hypertension   . BPH (benign prostatic hyperplasia)   . Sleep apnea     wears CPAP  . Hemorrhoid   . Arthritis    Past Surgical History  Procedure Laterality Date  . Cholecystectomy    . Nasal sinus surgery    . Septoplasty    . Tonsillectomy    . Teeth extracted      '08  . Cataract with iol od    . Cataract with iol os- complications requiring  laser therapy and drops    . Back surgery    . Viterectomy Left 12/26/2012    Dr [name]  . Pars plana vitrectomy Left 12/26/2012    Procedure: PARS PLANA VITRECTOMY WITH 25G REMOVAL/SUTURE INTRAOCULAR LENS;  Surgeon: Donald Pedro, MD;  Location: [facility];  Service: Ophthalmology;  Laterality: Left;   Family History  Problem Relation Age of Onset  . Hyperlipidemia Mother   .  Hypertension Mother   . Cancer Father     lung cancer   History   Social History  . Marital Status: Married    Spouse Name: brenda    Number of Children: 3  . Years of Education: 18   Occupational History  . courier    Social History Main Topics  . Smoking status: Former Smoker -- 10 years  . Smokeless tobacco: Never Used     Comment: quit 15 years ago  . Alcohol Use: No  . Drug Use: No  . Sexual Activity: Not Currently   Other Topics Concern  . Not on file   Social History Narrative   [employer] [place], [employer]- [employer]. Married 1966.  3 boys, - '68, '73, '74   3 grandchildren. 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 SO- medical problems.ACP -End of life: no cpr, no prolonged Ventilator support, would consider HD, no heroic life- sustaining therapy, e.g. Artificial feeding or hydration in the long term.    Retired in August 2014.     Current Outpatient Prescriptions on File Prior to Visit  Medication Sig Dispense Refill  . ALPRAZolam (XANAX) 0.5 MG tablet TAKE 1 TABLET BY MOUTH EVERY 6 HOURS AS NEEDED FOR SLEEP  90 tablet  0  . amLODipine (NORVASC) 5 MG tablet Take 1 tablet (5 mg total) by mouth daily.  90 tablet  3  . aspirin EC 81 MG tablet Take 81 mg by mouth every evening.      Marland Kitchen b complex vitamins tablet Take 1 tablet by mouth daily.        . Calcium Carbonate-Vitamin D (CALCIUM 600 + D PO) Take 1 tablet by mouth daily.      . cholecalciferol (VITAMIN D) 1000 UNITS tablet Take 1,000 Units by mouth daily.      . CRESTOR 40 MG tablet TAKE 1 TABLET BY MOUTH EVERY DAY  90 tablet  0  . diphenhydrAMINE (BENADRYL) 25 MG tablet Take 25 mg by mouth at bedtime as needed for allergies. Generic Allergy medication from [facility]      . dorzolamide (TRUSOPT) 2 % ophthalmic solution 1 drop 2 (two) times daily.      Marland Kitchen doxazosin (CARDURA) 2 MG tablet Take 2 mg by mouth daily.      Marland Kitchen ezetimibe-simvastatin (VYTORIN) 10-40 MG per tablet Take 1 tablet by mouth daily.      . finasteride (PROSCAR) 5 MG tablet TAKE 1 TABLET BY MOUTH DAILY  90 tablet  3  . Garlic 123XX123 MG CAPS Take 1,000 mg by mouth daily.      . Multiple Vitamin (MULTIVITAMIN WITH MINERALS) TABS Take 1 tablet by mouth daily.      . Omega-3 Fatty Acids (FISH OIL) 1000 MG CAPS Take 1,000 mg by mouth daily.      Marland Kitchen venlafaxine XR (EFFEXOR-XR) 150 MG 24 hr capsule TAKE ONE CAPSULE BY MOUTH EVERY DAY  90 capsule  1  . vitamin C (ASCORBIC ACID) 500 MG tablet Take 500 mg by mouth daily.      . Zinc 50 MG TABS Take 50 mg by mouth daily.      . quinapril (ACCUPRIL)  10 MG tablet Take 10 mg by mouth every evening.       No current facility-administered medications on file prior to visit.     Review of Systems Constitutional:  Negative for fever, chills, activity change and unexpected weight change.  HEENT:  Negative for hearing loss, ear pain, congestion,  neck stiffness and postnasal drip. Negative for sore throat or swallowing problems. Negative for dental complaints.   Eyes: Negative for vision loss or change in visual acuity.  Respiratory: Negative for chest tightness and wheezing. Negative for DOE.   Cardiovascular: Negative for chest pain or palpitations. No decreased exercise tolerance Gastrointestinal: No change in bowel habit. No bloating or gas. No reflux or indigestion Genitourinary: Negative for urgency, frequency, flank pain and difficulty urinating. Nocturia up to 2-3. Musculoskeletal: Negative for myalgias, back pain, arthralgias and gait problem.  Neurological: Negative for dizziness, tremors, weakness and headaches.  Hematological: Negative for adenopathy.  Psychiatric/Behavioral: Negative for behavioral problems and dysphoric mood.        Objective:   Physical Exam Filed Vitals:   12/04/13 0839  BP: 132/86  Pulse: 85  Temp: 98.1 F (36.7 C)   Wt Readings from Last 3 Encounters:  12/04/13 279 lb 6.4 oz (126.735 kg)  08/31/13 271 lb 4 oz (123.038 kg)  12/26/12 276 lb 0.3 oz (125.2 kg)   Gen'l: Well nourished well developed, obese male in no acute distress  HEENT: Head: Normocephalic and atraumatic. Right Ear: External ear normal. EAC/TM nl. Left Ear: External ear normal.  EAC/TM nl. Nose: Nose normal. Mouth/Throat: Oropharynx is clear and moist. Dentition - full dentures. No buccal lesions. Posterior pharynx clear. Eyes: Conjunctivae and sclera clear. EOM intact. Pupils: right, round, and reactive to light; left irregular and slow to react to light. Right eye exhibits no discharge. Left eye exhibits no discharge. Neck: Normal  range of motion. Neck supple. No JVD present. No tracheal deviation present. No thyromegaly present.  Cardiovascular: Normal rate, regular rhythm, no gallop, no friction rub, no murmur heard.      Quiet precordium. 2+ radial and DP pulses . No carotid bruits Pulmonary/Chest: Effort normal. No respiratory distress or increased WOB, no wheezes, no rales. No chest wall deformity or CVAT. Abdomen: Soft. Obese. Bowel sounds are normal in all quadrants. He exhibits no distension, no tenderness, no rebound or guarding, No heptosplenomegaly  Genitourinary:  deferred Musculoskeletal: Normal range of motion. He exhibits no edema and no tenderness.       Small and large joints without redness, synovial thickening or deformity. Full range of motion preserved about all small, median and large joints.  Lymphadenopathy:    He has no cervical or supraclavicular adenopathy.  Neurological: He is alert and oriented to person, place, and time. CN II-XII intact. DTRs 2+ and symmetrical biceps, radial and patellar tendons. Cerebellar function normal with no tremor, rigidity, normal gait and station.  Skin: Skin is warm and dry. No rash noted. No erythema. glistening flesh colored raised lesion right cheek with small blood vessels c/w basal cell carcinoma. Psychiatric: He has a normal mood and affect. His behavior is normal. Thought content normal.   Recent Results (from the past 2160 hour(s))  LIPID PANEL     Status: Abnormal   Collection Time    12/04/13 10:38 AM      Result Value Ref Range   Cholesterol 159  0 - 200 mg/dL   Comment: ATP III Classification       Desirable:  < 200 mg/dL               Borderline High:  200 - 239 mg/dL          High:  > = 240 mg/dL   Triglycerides 175.0 (*) 0.0 - 149.0 mg/dL   Comment: Normal:  <150 mg/dLBorderline High:  150 -  199 mg/dL   HDL 61.00  >39.00 mg/dL   VLDL 35.0  0.0 - 40.0 mg/dL   LDL Cholesterol 63  0 - 99 mg/dL   Total CHOL/HDL Ratio 3     Comment:                 Men          Women1/2 Average Risk     3.4          3.3Average Risk          5.0          4.42X Average Risk          9.6          7.13X Average Risk          15.0          11.0                      COMPREHENSIVE METABOLIC PANEL     Status: None   Collection Time    12/04/13 10:38 AM      Result Value Ref Range   Sodium 138  135 - 145 mEq/L   Potassium 4.3  3.5 - 5.1 mEq/L   Chloride 105  96 - 112 mEq/L   CO2 26  19 - 32 mEq/L   Glucose, Bld 99  70 - 99 mg/dL   BUN 16  6 - 23 mg/dL   Creatinine, Ser 0.9  0.4 - 1.5 mg/dL   Total Bilirubin 0.7  0.3 - 1.2 mg/dL   Alkaline Phosphatase 67  39 - 117 U/L   AST 28  0 - 37 U/L   ALT 20  0 - 53 U/L   Total Protein 7.4  6.0 - 8.3 g/dL   Albumin 4.2  3.5 - 5.2 g/dL   Calcium 9.6  8.4 - 10.5 mg/dL   GFR 94.64  >60.00 mL/min        Assessment & Plan:

## 2013-12-04 NOTE — Patient Instructions (Signed)
Thanks for coming in.  Your exam is ok except for weight - please continue to work on that with diet and walking  \routine labs ordered for today and results will be posted to [facility].  Immunizations - given Prevnar today.  Please return for a incisional biopsy of the mole on your face.   Thank you for your trust and confidence over the years.

## 2013-12-04 NOTE — Progress Notes (Signed)
Pre visit review using our clinic review tool, if applicable. No additional management support is needed unless otherwise documented below in the visit note. 

## 2013-12-05 NOTE — Assessment & Plan Note (Signed)
Interval history is unremarkable. Physical exam notable for weight issues. Labs reviewed - no significant abnormalities. He is current with colorectal cancer screening and has aged out of prostate cancer screening. Immunizations  - may need Pneumovax booster in 2 months since he had Prevnar today.  In summary A nice man who is medically stable at this time. He will return for mole excision from right cheek.

## 2013-12-05 NOTE — Assessment & Plan Note (Signed)
Patient's anxiety is well controlled. He has agreed to help his wife restart counseling, and he will go with her, with a counselor they have worked with in the past.

## 2013-12-05 NOTE — Assessment & Plan Note (Signed)
Taking and tolerating "STatin" therapy. Last lab with excellent control with LDL better than goal, HDL better than goal and LFTs normal.   Plan Continue present regimen

## 2013-12-05 NOTE — Assessment & Plan Note (Signed)
BP Readings from Last 3 Encounters:  12/04/13 132/86  08/31/13 134/84  12/27/12 144/76   Good control on present regimen

## 2013-12-07 ENCOUNTER — Encounter: Payer: Self-pay | Admitting: Internal Medicine

## 2013-12-08 ENCOUNTER — Ambulatory Visit (INDEPENDENT_AMBULATORY_CARE_PROVIDER_SITE_OTHER): Payer: Medicare Other | Admitting: Family Medicine

## 2013-12-08 VITALS — BP 128/78 | HR 69 | Temp 98.5°F | Resp 18 | Ht 72.0 in | Wt 266.4 lb

## 2013-12-08 DIAGNOSIS — K219 Gastro-esophageal reflux disease without esophagitis: Secondary | ICD-10-CM

## 2013-12-08 DIAGNOSIS — R197 Diarrhea, unspecified: Secondary | ICD-10-CM

## 2013-12-08 DIAGNOSIS — R110 Nausea: Secondary | ICD-10-CM

## 2013-12-08 LAB — POCT CBC
Granulocyte percent: 43.3 %G (ref 37–80)
HCT, POC: 49.5 % (ref 43.5–53.7)
Hemoglobin: 15.7 g/dL (ref 14.1–18.1)
Lymph, poc: 2.8 (ref 0.6–3.4)
MCH, POC: 30.6 pg (ref 27–31.2)
MCHC: 31.7 g/dL — AB (ref 31.8–35.4)
MCV: 96.4 fL (ref 80–97)
MID (cbc): 0.6 (ref 0–0.9)
MPV: 8.5 fL (ref 0–99.8)
POC Granulocyte: 2.6 (ref 2–6.9)
POC LYMPH PERCENT: 47.3 %L (ref 10–50)
POC MID %: 9.4 %M (ref 0–12)
Platelet Count, POC: 156 10*3/uL (ref 142–424)
RBC: 5.13 M/uL (ref 4.69–6.13)
RDW, POC: 14.8 %
WBC: 5.9 10*3/uL (ref 4.6–10.2)

## 2013-12-08 MED ORDER — ONDANSETRON 4 MG PO TBDP: 4.0000 mg | ORAL_TABLET | Freq: Three times a day (TID) | ORAL | Status: DC | PRN

## 2013-12-08 NOTE — Progress Notes (Signed)
73 yo gentleman with nausea and diarrhea since Wednesday.  No vomiting.  Has taken immodium with some success, but stomach continues to rumble and generalized soreness in midsection.  Has noted some blood when wiping (believes that it is hemorrhoid)  Colonoscopy by Dr. Sharlett Iles  Associated:  Acid reflux and belching   Objective:  NAD No jaundice Chest: clear Heart:  Reg, no murmur Results for orders placed in visit on 12/08/13  POCT CBC      Result Value Ref Range   WBC 5.9  4.6 - 10.2 K/uL   Lymph, poc 2.8  0.6 - 3.4   POC LYMPH PERCENT 47.3  10 - 50 %L   MID (cbc) 0.6  0 - 0.9   POC MID % 9.4  0 - 12 %M   POC Granulocyte 2.6  2 - 6.9   Granulocyte percent 43.3  37 - 80 %G   RBC 5.13  4.69 - 6.13 M/uL   Hemoglobin 15.7  14.1 - 18.1 g/dL   HCT, POC 49.5  43.5 - 53.7 %   MCV 96.4  80 - 97 fL   MCH, POC 30.6  27 - 31.2 pg   MCHC 31.7 (*) 31.8 - 35.4 g/dL   RDW, POC 14.8     Platelet Count, POC 156  142 - 424 K/uL   MPV 8.5  0 - 99.8 fL   Assessment:  Nausea alone - Plan: ondansetron (ZOFRAN ODT) 4 MG disintegrating tablet  Diarrhea - Plan: POCT CBC, ondansetron (ZOFRAN ODT) 4 MG disintegrating tablet  GERD (gastroesophageal reflux disease)  Signed, Robyn Haber, MD

## 2013-12-08 NOTE — Patient Instructions (Addendum)
Probiotics (e.g. Align, Culturelle) No milk or dairy (cheese or ice cream)  Call tomorrow if not improving.

## 2013-12-12 ENCOUNTER — Encounter (INDEPENDENT_AMBULATORY_CARE_PROVIDER_SITE_OTHER): Payer: Medicare Other | Admitting: Ophthalmology

## 2013-12-12 DIAGNOSIS — H43819 Vitreous degeneration, unspecified eye: Secondary | ICD-10-CM

## 2013-12-12 DIAGNOSIS — I10 Essential (primary) hypertension: Secondary | ICD-10-CM

## 2013-12-12 DIAGNOSIS — H35039 Hypertensive retinopathy, unspecified eye: Secondary | ICD-10-CM

## 2013-12-19 ENCOUNTER — Encounter: Payer: Self-pay | Admitting: Internal Medicine

## 2013-12-19 ENCOUNTER — Ambulatory Visit (INDEPENDENT_AMBULATORY_CARE_PROVIDER_SITE_OTHER): Payer: Medicare Other | Admitting: Internal Medicine

## 2013-12-19 VITALS — BP 130/80 | HR 56 | Temp 97.3°F | Resp 18 | Ht 71.0 in | Wt 279.2 lb

## 2013-12-19 DIAGNOSIS — D485 Neoplasm of uncertain behavior of skin: Secondary | ICD-10-CM

## 2013-12-19 NOTE — Progress Notes (Signed)
   Subjective:    [name], male    DOB: 1941/02/01, 73 y.o.   MRN: [id number]  HPI Mr. Staples presents for excision of a suspicious mole, basal cell carcinoma, right cheek  PMH, FamHx and SocHx reviewed for any changes and relevance.    Review of Systems System review is negative for any constitutional, cardiac, pulmonary, GI or neuro symptoms or complaints other than as described in the HPI.     Objective:   Physical Exam Filed Vitals:   12/19/13 0928  BP: 130/80  Pulse: 56  Temp: 97.3 F (36.3 C)  Resp: 18   Procedure: excisional biopsy-right cheek Consent - signed consent done after explained risks of infection, bleeding and scarring Anesth - 2% xylocain w/ epinephrine Prep - betadine In a sterile fashion using a #15 blade an elipitical incision was made at an angle in line with facial crease encompassing a 1 cm raised, flesh colored lesion with fine vascular markings on the surface. Resulting wound was 2.5 cm in length.  Closed the wound with 6 interrupted sutures using 6-0 ethilon. Specimen sent to path.   Cleansed the site and applied a bandaide.  Patient advised that he can was gently wash the wound. May or may not cover with bandaid. Given standard wound precautions  He will return in 5-6 days for suture removal      Assessment & Plan:  Lesion of uncertain behavior - most likely a basal cell carcinoma right face.

## 2013-12-19 NOTE — Progress Notes (Signed)
Pre visit review using our clinic review tool, if applicable. No additional management support is needed unless otherwise documented below in the visit note. 

## 2013-12-19 NOTE — Addendum Note (Signed)
Addended by: Roma Schanz R on: 12/19/2013 10:20 AM   Modules accepted: Orders

## 2013-12-25 ENCOUNTER — Ambulatory Visit (INDEPENDENT_AMBULATORY_CARE_PROVIDER_SITE_OTHER): Payer: Self-pay | Admitting: Internal Medicine

## 2013-12-25 ENCOUNTER — Encounter: Payer: Self-pay | Admitting: Internal Medicine

## 2013-12-25 VITALS — BP 130/88 | HR 66 | Temp 97.7°F | Ht 71.0 in | Wt 270.0 lb

## 2013-12-25 DIAGNOSIS — D219 Benign neoplasm of connective and other soft tissue, unspecified: Secondary | ICD-10-CM

## 2013-12-25 DIAGNOSIS — D239 Other benign neoplasm of skin, unspecified: Secondary | ICD-10-CM

## 2013-12-25 DIAGNOSIS — Z4802 Encounter for removal of sutures: Secondary | ICD-10-CM

## 2013-12-25 NOTE — Progress Notes (Signed)
Subjective:    [name], male    DOB: 05-17-1941, 73 y.o.   MRN: [id number]  HPI [name] presents for suture removal. He had a lesion excised from the right face below the orbit closed with 4 6-0 ethilon sutures. In the interim he shave over the wound cutting of the tails of the suture! He has had no signs of infection  PMH, FamHx and SocHx reviewed for any changes and relevance.  Current Outpatient Prescriptions on File Prior to Visit  Medication Sig Dispense Refill  . ALPRAZolam (XANAX) 0.5 MG tablet TAKE 1 TABLET BY MOUTH EVERY 6 HOURS AS NEEDED FOR SLEEP  90 tablet  0  . amLODipine (NORVASC) 5 MG tablet Take 1 tablet (5 mg total) by mouth daily.  90 tablet  3  . aspirin EC 81 MG tablet Take 81 mg by mouth every evening.      Marland Kitchen b complex vitamins tablet Take 1 tablet by mouth daily.        . Calcium Carbonate-Vitamin D (CALCIUM 600 + D PO) Take 1 tablet by mouth daily.      . cholecalciferol (VITAMIN D) 1000 UNITS tablet Take 1,000 Units by mouth daily.      . diphenhydrAMINE (BENADRYL) 25 MG tablet Take 25 mg by mouth at bedtime as needed for allergies. Generic Allergy medication from [facility]      . dorzolamide (TRUSOPT) 2 % ophthalmic solution 1 drop 2 (two) times daily.      Marland Kitchen doxazosin (CARDURA) 2 MG tablet Take 2 mg by mouth daily.      Marland Kitchen ezetimibe (ZETIA) 10 MG tablet Take 1 tablet (10 mg total) by mouth daily.  90 tablet  3  . finasteride (PROSCAR) 5 MG tablet TAKE 1 TABLET BY MOUTH DAILY  90 tablet  3  . Garlic 7829 MG CAPS Take 1,000 mg by mouth daily.      . Loperamide HCl (IMODIUM PO) Take by mouth.      . Multiple Vitamin (MULTIVITAMIN WITH MINERALS) TABS Take 1 tablet by mouth daily.      . Omega-3 Fatty Acids (FISH OIL) 1000 MG CAPS Take 1,000 mg by mouth daily.      Marland Kitchen omeprazole (PRILOSEC) 20 MG capsule Take 20 mg by mouth daily.      . ondansetron (ZOFRAN ODT) 4 MG disintegrating tablet Take 1 tablet (4 mg total) by mouth every 8 (eight) hours as  needed for nausea or vomiting.  8 tablet  0  . simvastatin (ZOCOR) 40 MG tablet Take 1 tablet (40 mg total) by mouth at bedtime.  90 tablet  3  . venlafaxine XR (EFFEXOR-XR) 150 MG 24 hr capsule TAKE ONE CAPSULE BY MOUTH EVERY DAY  90 capsule  1  . vitamin C (ASCORBIC ACID) 500 MG tablet Take 500 mg by mouth daily.      . Zinc 50 MG TABS Take 50 mg by mouth daily.      . quinapril (ACCUPRIL) 10 MG tablet Take 10 mg by mouth every evening.       No current facility-administered medications on file prior to visit.      Review of Systems No review for this visit    Objective:   Physical Exam Filed Vitals:   12/25/13 0836  BP: 130/88  Pulse: 66  Temp: 97.7 F (36.5 C)   Derm - well healed wound right face  Suture removal - had great difficulty due to suture tails having  been cut off flush with the skin. Use a combination of small suture scissors and an #18g needle. The process of removal caused slight dehiscence of the wound surface and lead to a very small amount of bleeding.       Assessment & Plan:  Suture removal -patient advised that it will take a little longer to have complete healing and that there may be an increase in scarring due to difficulty with suture removal.   Final path: lesion was a neurofibroma.

## 2013-12-25 NOTE — Progress Notes (Signed)
Pre visit review using our clinic review tool, if applicable. No additional management support is needed unless otherwise documented below in the visit note. 

## 2014-01-28 ENCOUNTER — Telehealth: Payer: Self-pay | Admitting: Internal Medicine

## 2014-01-28 MED ORDER — VENLAFAXINE HCL ER 150 MG PO CP24: ORAL_CAPSULE | ORAL | Status: DC

## 2014-01-28 MED ORDER — AMLODIPINE BESYLATE 5 MG PO TABS: 5.0000 mg | ORAL_TABLET | Freq: Every day | ORAL | Status: DC

## 2014-01-28 NOTE — Telephone Encounter (Signed)
[name] is a transfer from Dr. Linda Hedges.  He saw Dr. Linda Hedges March 11.  He made an appt to see Dr. Ronnald Ramp on May 6. He needs refills on Venlafaxine 150 mg and Amlodipine called in to [facility] on [address].  He will run out this Sunday.

## 2014-02-13 ENCOUNTER — Telehealth: Payer: Self-pay | Admitting: Internal Medicine

## 2014-02-13 ENCOUNTER — Encounter: Payer: Self-pay | Admitting: Internal Medicine

## 2014-02-13 ENCOUNTER — Ambulatory Visit (INDEPENDENT_AMBULATORY_CARE_PROVIDER_SITE_OTHER): Payer: Medicare Other | Admitting: Internal Medicine

## 2014-02-13 VITALS — BP 130/70 | HR 64 | Temp 98.0°F | Resp 16 | Ht 71.0 in | Wt 283.2 lb

## 2014-02-13 DIAGNOSIS — F411 Generalized anxiety disorder: Secondary | ICD-10-CM

## 2014-02-13 DIAGNOSIS — E785 Hyperlipidemia, unspecified: Secondary | ICD-10-CM

## 2014-02-13 DIAGNOSIS — I10 Essential (primary) hypertension: Secondary | ICD-10-CM

## 2014-02-13 DIAGNOSIS — N400 Enlarged prostate without lower urinary tract symptoms: Secondary | ICD-10-CM

## 2014-02-13 DIAGNOSIS — F329 Major depressive disorder, single episode, unspecified: Secondary | ICD-10-CM

## 2014-02-13 DIAGNOSIS — K219 Gastro-esophageal reflux disease without esophagitis: Secondary | ICD-10-CM

## 2014-02-13 DIAGNOSIS — F3289 Other specified depressive episodes: Secondary | ICD-10-CM

## 2014-02-13 NOTE — Progress Notes (Signed)
Pre visit review using our clinic review tool, if applicable. No additional management support is needed unless otherwise documented below in the visit note. 

## 2014-02-13 NOTE — Patient Instructions (Signed)

## 2014-02-13 NOTE — Progress Notes (Signed)
   Subjective:    [name], male    DOB: January 14, 1941, 73 y.o.   MRN: [id number]  Hypertension This is a chronic problem. The current episode started more than 1 year ago. The problem has been gradually improving since onset. The problem is controlled. Associated symptoms include anxiety. Pertinent negatives include no blurred vision, chest pain, headaches, malaise/fatigue, neck pain, orthopnea, palpitations, peripheral edema, PND, shortness of breath or sweats. Past treatments include calcium channel blockers, Donald 1 blockers and ACE inhibitors. The current treatment provides moderate improvement. Compliance problems include diet and exercise.  Identifiable causes of hypertension include sleep apnea.      Review of Systems  Constitutional: Negative.  Negative for fever, chills, malaise/fatigue, diaphoresis, appetite change and fatigue.  HENT: Negative.   Eyes: Negative.  Negative for blurred vision.  Respiratory: Positive for apnea. Negative for cough, choking, chest tightness, shortness of breath, wheezing and stridor.   Cardiovascular: Negative.  Negative for chest pain, palpitations, orthopnea, leg swelling and PND.  Gastrointestinal: Negative.  Negative for nausea, vomiting, abdominal pain, diarrhea, constipation and blood in stool.  Endocrine: Negative.   Genitourinary: Negative.   Musculoskeletal: Negative.  Negative for neck pain.  Skin: Negative.   Allergic/Immunologic: Negative.   Neurological: Negative.  Negative for dizziness, tremors, syncope, weakness, light-headedness, numbness and headaches.  Hematological: Negative.  Negative for adenopathy. Does not bruise/bleed easily.  Psychiatric/Behavioral: Negative.        Objective:   Physical Exam  Vitals reviewed. Constitutional: He is oriented to person, place, and time. He appears well-developed and well-nourished. No distress.  HENT:  Head: Normocephalic and atraumatic.  Mouth/Throat: Oropharynx is clear  and moist. No oropharyngeal exudate.  Eyes: Conjunctivae are normal. Right eye exhibits no discharge. Left eye exhibits no discharge. No scleral icterus.  Neck: Normal range of motion. Neck supple. No JVD present. No tracheal deviation present. No thyromegaly present.  Cardiovascular: Normal rate, regular rhythm, normal heart sounds and intact distal pulses.  Exam reveals no gallop and no friction rub.   No murmur heard. Pulmonary/Chest: Effort normal and breath sounds normal. No stridor. No respiratory distress. He has no wheezes. He has no rales. He exhibits no tenderness.  Abdominal: Soft. Bowel sounds are normal. He exhibits no distension and no mass. There is no tenderness. There is no rebound and no guarding.  Musculoskeletal: Normal range of motion. He exhibits no edema and no tenderness.  Lymphadenopathy:    He has no cervical adenopathy.  Neurological: He is oriented to person, place, and time.  Skin: Skin is warm and dry. No rash noted. He is not diaphoretic. No erythema. No pallor.     Lab Results  Component Value Date   WBC 5.9 12/08/2013   HGB 15.7 12/08/2013   HCT 49.5 12/08/2013   PLT 142* 12/26/2012   GLUCOSE 99 12/04/2013   CHOL 159 12/04/2013   TRIG 175.0* 12/04/2013   HDL 61.00 12/04/2013   LDLDIRECT 96.4 09/19/2007   LDLCALC 63 12/04/2013   ALT 20 12/04/2013   AST 28 12/04/2013   NA 138 12/04/2013   K 4.3 12/04/2013   CL 105 12/04/2013   CREATININE 0.9 12/04/2013   BUN 16 12/04/2013   CO2 26 12/04/2013   TSH 1.61 04/11/2008   PSA 0.43 09/11/2009       Assessment & Plan:

## 2014-02-13 NOTE — Assessment & Plan Note (Signed)
His BP is well controlled 

## 2014-02-13 NOTE — Assessment & Plan Note (Signed)
He sis doing well on the statin

## 2014-02-13 NOTE — Telephone Encounter (Signed)
Relevant patient education assigned to patient using Emmi. ° °

## 2014-02-19 ENCOUNTER — Telehealth: Payer: Self-pay | Admitting: *Deleted

## 2014-02-19 DIAGNOSIS — I10 Essential (primary) hypertension: Secondary | ICD-10-CM

## 2014-02-19 DIAGNOSIS — F411 Generalized anxiety disorder: Secondary | ICD-10-CM

## 2014-02-19 MED ORDER — QUINAPRIL HCL 10 MG PO TABS: 10.0000 mg | ORAL_TABLET | Freq: Every day | ORAL | Status: DC

## 2014-02-19 MED ORDER — ALPRAZOLAM 0.5 MG PO TABS: ORAL_TABLET | ORAL | Status: DC

## 2014-02-19 NOTE — Telephone Encounter (Signed)
Pt called requesting Quinapril and Alprazolam refill.  Please advise

## 2014-02-19 NOTE — Telephone Encounter (Signed)
Spoke with pt advised Rx sent 

## 2014-02-19 NOTE — Telephone Encounter (Signed)
done

## 2014-05-10 ENCOUNTER — Encounter: Payer: Self-pay | Admitting: Internal Medicine

## 2014-05-10 ENCOUNTER — Ambulatory Visit (INDEPENDENT_AMBULATORY_CARE_PROVIDER_SITE_OTHER)
Admission: RE | Admit: 2014-05-10 | Discharge: 2014-05-10 | Disposition: A | Discharge status: Home / Self Care | Payer: Medicare Other | Source: Ambulatory Visit | Attending: Internal Medicine | Admitting: Internal Medicine

## 2014-05-10 ENCOUNTER — Ambulatory Visit (INDEPENDENT_AMBULATORY_CARE_PROVIDER_SITE_OTHER): Payer: Medicare Other | Admitting: Internal Medicine

## 2014-05-10 VITALS — BP 120/80 | HR 65 | Temp 98.6°F | Resp 16 | Ht 71.0 in | Wt 288.4 lb

## 2014-05-10 DIAGNOSIS — R05 Cough: Secondary | ICD-10-CM

## 2014-05-10 DIAGNOSIS — R059 Cough, unspecified: Secondary | ICD-10-CM

## 2014-05-10 DIAGNOSIS — I10 Essential (primary) hypertension: Secondary | ICD-10-CM

## 2014-05-10 DIAGNOSIS — J13 Pneumonia due to Streptococcus pneumoniae: Secondary | ICD-10-CM | POA: Insufficient documentation

## 2014-05-10 DIAGNOSIS — R911 Solitary pulmonary nodule: Secondary | ICD-10-CM | POA: Insufficient documentation

## 2014-05-10 MED ORDER — PROMETHAZINE-DM 6.25-15 MG/5ML PO SYRP
5.0000 mL | ORAL_SOLUTION | Freq: Four times a day (QID) | ORAL | Status: DC | PRN
Start: 2014-05-10 — End: 2014-06-10

## 2014-05-10 MED ORDER — AZITHROMYCIN 500 MG PO TABS: 500.0000 mg | ORAL_TABLET | Freq: Every day | ORAL | Status: DC

## 2014-05-10 NOTE — Progress Notes (Signed)
Subjective:    [name], male    DOB: 1941/05/06, 73 y.o.   MRN: [id number]  Cough This is a recurrent problem. The current episode started more than 1 month ago. The problem has been gradually worsening. The problem occurs every few minutes. The cough is productive of purulent sputum. Pertinent negatives include no chest pain, chills, ear congestion, ear pain, fever, headaches, heartburn, hemoptysis, myalgias, nasal congestion, postnasal drip, rash, rhinorrhea, sore throat, shortness of breath, sweats, weight loss or wheezing. Nothing aggravates the symptoms. He has tried OTC cough suppressant for the symptoms. The treatment provided no relief. There is no history of asthma, bronchiectasis, bronchitis, COPD, emphysema, environmental allergies or pneumonia.      Review of Systems  Constitutional: Negative.  Negative for fever, chills, weight loss, diaphoresis, appetite change and fatigue.  HENT: Negative.  Negative for congestion, ear pain, postnasal drip, rhinorrhea, sore throat, trouble swallowing and voice change.   Eyes: Negative.   Respiratory: Positive for cough. Negative for apnea, hemoptysis, choking, chest tightness, shortness of breath, wheezing and stridor.   Cardiovascular: Negative.  Negative for chest pain, palpitations and leg swelling.  Gastrointestinal: Negative.  Negative for heartburn, nausea, abdominal pain, diarrhea, constipation and blood in stool.  Endocrine: Negative.   Genitourinary: Negative.   Musculoskeletal: Negative.  Negative for arthralgias, back pain and myalgias.  Skin: Negative.  Negative for rash.  Allergic/Immunologic: Negative.  Negative for environmental allergies.  Neurological: Negative.  Negative for headaches.  Hematological: Negative.  Negative for adenopathy. Does not bruise/bleed easily.  Psychiatric/Behavioral: Negative.        Objective:   Physical Exam  Vitals reviewed. Constitutional: He is oriented to person,  place, and time. He appears well-developed and well-nourished.  Non-toxic appearance. He does not have a sickly appearance. He does not appear ill. No distress.  HENT:  Head: Normocephalic and atraumatic.  Mouth/Throat: Oropharynx is clear and moist. No oropharyngeal exudate.  Eyes: Conjunctivae are normal. Right eye exhibits no discharge. Left eye exhibits no discharge. No scleral icterus.  Neck: Normal range of motion. Neck supple. No JVD present. No tracheal deviation present. No thyromegaly present.  Cardiovascular: Normal rate, regular rhythm, normal heart sounds and intact distal pulses.  Exam reveals no gallop and no friction rub.   No murmur heard. Pulmonary/Chest: Effort normal and breath sounds normal. No accessory muscle usage or stridor. Not tachypneic. No respiratory distress. He has no decreased breath sounds. He has no wheezes. He has no rhonchi. He has no rales. He exhibits no tenderness.  Abdominal: Soft. Bowel sounds are normal. He exhibits no distension and no mass. There is no tenderness. There is no rebound and no guarding.  Musculoskeletal: Normal range of motion. He exhibits no edema and no tenderness.  Lymphadenopathy:    He has no cervical adenopathy.  Neurological: He is oriented to person, place, and time.  Skin: Skin is warm and dry. No rash noted. He is not diaphoretic. No erythema. No pallor.     Lab Results  Component Value Date   WBC 5.9 12/08/2013   HGB 15.7 12/08/2013   HCT 49.5 12/08/2013   PLT 142* 12/26/2012   GLUCOSE 99 12/04/2013   CHOL 159 12/04/2013   TRIG 175.0* 12/04/2013   HDL 61.00 12/04/2013   LDLDIRECT 96.4 09/19/2007   LDLCALC 63 12/04/2013   ALT 20 12/04/2013   AST 28 12/04/2013   NA 138 12/04/2013   K 4.3 12/04/2013   CL 105 12/04/2013  CREATININE 0.9 12/04/2013   BUN 16 12/04/2013   CO2 26 12/04/2013   TSH 1.61 04/11/2008   PSA 0.43 09/11/2009       Assessment & Plan:

## 2014-05-10 NOTE — Patient Instructions (Signed)
Cough, Adult  A cough is a reflex that helps clear your throat and airways. It can help heal the body or may be a reaction to an irritated airway. A cough may only last 2 or 3 weeks (acute) or may last more than 8 weeks (chronic).  CAUSES Acute cough:  Viral or bacterial infections. Chronic cough:  Infections.  Allergies.  Asthma.  Post-nasal drip.  Smoking.  Heartburn or acid reflux.  Some medicines.  Chronic lung problems (COPD).  Cancer. SYMPTOMS   Cough.  Fever.  Chest pain.  Increased breathing rate.  High-pitched whistling sound when breathing (wheezing).  Colored mucus that you cough up (sputum). TREATMENT   A bacterial cough may be treated with antibiotic medicine.  A viral cough must run its course and will not respond to antibiotics.  Your caregiver may recommend other treatments if you have a chronic cough. HOME CARE INSTRUCTIONS   Only take over-the-counter or prescription medicines for pain, discomfort, or fever as directed by your caregiver. Use cough suppressants only as directed by your caregiver.  Use a cold steam vaporizer or humidifier in your bedroom or home to help loosen secretions.  Sleep in a semi-upright position if your cough is worse at night.  Rest as needed.  Stop smoking if you smoke. SEEK IMMEDIATE MEDICAL CARE IF:   You have pus in your sputum.  Your cough starts to worsen.  You cannot control your cough with suppressants and are losing sleep.  You begin coughing up blood.  You have difficulty breathing.  You develop pain which is getting worse or is uncontrolled with medicine.  You have a fever. MAKE SURE YOU:   Understand these instructions.  Will watch your condition.  Will get help right away if you are not doing well or get worse. Document Released: 03/26/2011 Document Revised: 12/20/2011 Document Reviewed: 03/26/2011 Exit[employer] Patient Information 2015 [employer]. This information is not intended  to replace advice given to you by your health care provider. Make sure you discuss any questions you have with your health care provider.  

## 2014-05-10 NOTE — Progress Notes (Signed)
Pre visit review using our clinic review tool, if applicable. No additional management support is needed unless otherwise documented below in the visit note. 

## 2014-05-11 ENCOUNTER — Encounter: Payer: Self-pay | Admitting: Internal Medicine

## 2014-05-11 NOTE — Assessment & Plan Note (Signed)
Due to the cough he will stop the ACEI Will recheck his BP and add another agent if needed

## 2014-05-11 NOTE — Assessment & Plan Note (Signed)
He has a new nodule, CT is recommended to see if there is concern for a malignancy

## 2014-05-11 NOTE — Assessment & Plan Note (Addendum)
Will treat the infection with Zpak and will control the cough with phenergan-dm

## 2014-05-11 NOTE — Assessment & Plan Note (Signed)
I think this may be caused by the ACEI so I have asked him to stop Quinapril Will also get the CT done to see if there is concern for cancer

## 2014-05-16 ENCOUNTER — Other Ambulatory Visit: Payer: Self-pay | Admitting: Internal Medicine

## 2014-05-16 DIAGNOSIS — I10 Essential (primary) hypertension: Secondary | ICD-10-CM

## 2014-05-20 ENCOUNTER — Other Ambulatory Visit: Payer: Self-pay | Admitting: Internal Medicine

## 2014-05-20 ENCOUNTER — Other Ambulatory Visit (INDEPENDENT_AMBULATORY_CARE_PROVIDER_SITE_OTHER): Payer: Medicare Other

## 2014-05-20 ENCOUNTER — Other Ambulatory Visit: Payer: Self-pay

## 2014-05-20 ENCOUNTER — Telehealth: Payer: Self-pay | Admitting: Internal Medicine

## 2014-05-20 DIAGNOSIS — F411 Generalized anxiety disorder: Secondary | ICD-10-CM

## 2014-05-20 DIAGNOSIS — I10 Essential (primary) hypertension: Secondary | ICD-10-CM

## 2014-05-20 LAB — CREATININE, SERUM: Creatinine, Ser: 0.9 mg/dL (ref 0.4–1.5)

## 2014-05-20 LAB — BUN: BUN: 15 mg/dL (ref 6–23)

## 2014-05-20 MED ORDER — ALPRAZOLAM 0.5 MG PO TABS: ORAL_TABLET | ORAL | Status: DC

## 2014-05-20 NOTE — Telephone Encounter (Signed)
Pt came by to request Rx refill for ALPRAZOLAM. Pt states the pharmacy told him he would need another authorization for this request. Pt would like the Rx sent to [facility] on [address]. Please contact pt when request is complete.  Pt also wants to inform that he just has some labs performed and will be having other orders performed tomorrow.

## 2014-05-21 ENCOUNTER — Encounter: Payer: Self-pay | Admitting: Internal Medicine

## 2014-05-21 ENCOUNTER — Ambulatory Visit (INDEPENDENT_AMBULATORY_CARE_PROVIDER_SITE_OTHER)
Admission: RE | Admit: 2014-05-21 | Discharge: 2014-05-21 | Disposition: A | Discharge status: Home / Self Care | Payer: Medicare Other | Source: Ambulatory Visit | Attending: Internal Medicine | Admitting: Internal Medicine

## 2014-05-21 DIAGNOSIS — R059 Cough, unspecified: Secondary | ICD-10-CM

## 2014-05-21 DIAGNOSIS — J13 Pneumonia due to Streptococcus pneumoniae: Secondary | ICD-10-CM

## 2014-05-21 DIAGNOSIS — R05 Cough: Secondary | ICD-10-CM

## 2014-05-21 DIAGNOSIS — R911 Solitary pulmonary nodule: Secondary | ICD-10-CM

## 2014-05-21 MED ORDER — IOHEXOL 300 MG/ML  SOLN
80.0000 mL | Freq: Once | INTRAMUSCULAR | Status: AC | PRN
  Administered 2014-05-21: 80 mL via INTRAVENOUS

## 2014-05-22 ENCOUNTER — Telehealth: Payer: Self-pay | Admitting: Internal Medicine

## 2014-05-22 NOTE — Telephone Encounter (Signed)
Pt is calling to request CT result. Please call pt

## 2014-06-10 ENCOUNTER — Ambulatory Visit (INDEPENDENT_AMBULATORY_CARE_PROVIDER_SITE_OTHER): Payer: Medicare Other | Admitting: Internal Medicine

## 2014-06-10 ENCOUNTER — Encounter: Payer: Self-pay | Admitting: Internal Medicine

## 2014-06-10 VITALS — BP 140/90 | HR 72 | Temp 98.3°F | Resp 16 | Ht 71.0 in | Wt 289.0 lb

## 2014-06-10 DIAGNOSIS — R059 Cough, unspecified: Secondary | ICD-10-CM

## 2014-06-10 DIAGNOSIS — J13 Pneumonia due to Streptococcus pneumoniae: Secondary | ICD-10-CM

## 2014-06-10 DIAGNOSIS — R05 Cough: Secondary | ICD-10-CM

## 2014-06-10 DIAGNOSIS — I2510 Atherosclerotic heart disease of native coronary artery without angina pectoris: Secondary | ICD-10-CM

## 2014-06-10 DIAGNOSIS — Z23 Encounter for immunization: Secondary | ICD-10-CM

## 2014-06-10 DIAGNOSIS — I10 Essential (primary) hypertension: Secondary | ICD-10-CM

## 2014-06-10 MED ORDER — PROMETHAZINE-DM 6.25-15 MG/5ML PO SYRP: 5.0000 mL | ORAL_SOLUTION | Freq: Four times a day (QID) | ORAL | Status: DC | PRN

## 2014-06-10 NOTE — Assessment & Plan Note (Signed)
His cough is improving/resolving, will cont phenergan-dm as needed for the cough

## 2014-06-10 NOTE — Progress Notes (Signed)
   Subjective:    [name], male    DOB: Mar 09, 1941, 73 y.o.   MRN: [id number]  Cough This is a recurrent problem. The current episode started more than 1 month ago. The problem has been rapidly improving. The problem occurs every few hours. The cough is non-productive. Pertinent negatives include no chest pain, chills, ear congestion, ear pain, fever, headaches, heartburn, hemoptysis, myalgias, nasal congestion, postnasal drip, rash, rhinorrhea, sore throat, shortness of breath, sweats, weight loss or wheezing. He has tried prescription cough suppressant for the symptoms. The treatment provided significant relief. There is no history of asthma, bronchiectasis, bronchitis, COPD, emphysema, environmental allergies or pneumonia.      Review of Systems  Constitutional: Negative.  Negative for fever, chills, weight loss, diaphoresis, appetite change and fatigue.  HENT: Negative.  Negative for ear pain, postnasal drip, rhinorrhea and sore throat.   Eyes: Negative.   Respiratory: Positive for cough. Negative for hemoptysis, shortness of breath and wheezing.   Cardiovascular: Negative.  Negative for chest pain, palpitations and leg swelling.  Gastrointestinal: Negative.  Negative for heartburn, nausea, vomiting, abdominal pain, diarrhea, constipation and blood in stool.  Endocrine: Negative.   Genitourinary: Negative.   Musculoskeletal: Negative.  Negative for myalgias.  Skin: Negative.  Negative for rash.  Allergic/Immunologic: Negative for environmental allergies.  Neurological: Negative.  Negative for headaches.  Hematological: Negative.  Negative for adenopathy. Does not bruise/bleed easily.  Psychiatric/Behavioral: Negative.        Objective:   Physical Exam  Vitals reviewed. Constitutional: He is oriented to person, place, and time. He appears well-developed and well-nourished. No distress.  HENT:  Head: Normocephalic and atraumatic.  Mouth/Throat: Oropharynx is  clear and moist. No oropharyngeal exudate.  Eyes: Conjunctivae are normal. Right eye exhibits no discharge. Left eye exhibits no discharge. No scleral icterus.  Neck: Normal range of motion. Neck supple. No JVD present. No tracheal deviation present. No thyromegaly present.  Cardiovascular: Normal rate, regular rhythm, normal heart sounds and intact distal pulses.  Exam reveals no gallop and no friction rub.   No murmur heard. Pulmonary/Chest: Effort normal and breath sounds normal. No stridor. No respiratory distress. He has no wheezes. He has no rales. He exhibits no tenderness.  Abdominal: Soft. Bowel sounds are normal. He exhibits no distension and no mass. There is no tenderness. There is no rebound and no guarding.  Musculoskeletal: Normal range of motion. He exhibits no edema and no tenderness.  Lymphadenopathy:    He has no cervical adenopathy.  Neurological: He is oriented to person, place, and time.  Skin: Skin is warm and dry. No rash noted. He is not diaphoretic. No erythema. No pallor.  Psychiatric: He has a normal mood and affect. His behavior is normal. Judgment and thought content normal.     Lab Results  Component Value Date   WBC 5.9 12/08/2013   HGB 15.7 12/08/2013   HCT 49.5 12/08/2013   PLT 142* 12/26/2012   GLUCOSE 99 12/04/2013   CHOL 159 12/04/2013   TRIG 175.0* 12/04/2013   HDL 61.00 12/04/2013   LDLDIRECT 96.4 09/19/2007   LDLCALC 63 12/04/2013   ALT 20 12/04/2013   AST 28 12/04/2013   NA 138 12/04/2013   K 4.3 12/04/2013   CL 105 12/04/2013   CREATININE 0.9 05/20/2014   BUN 15 05/20/2014   CO2 26 12/04/2013   TSH 1.61 04/11/2008   PSA 0.43 09/11/2009       Assessment & Plan:

## 2014-06-10 NOTE — Assessment & Plan Note (Signed)
He has no symptoms related to this but his EKG shows a Q wave in V1 He can't do a treadmill test, I have ordered a lexiscan to see if there is significant CAD

## 2014-06-10 NOTE — Assessment & Plan Note (Signed)
His BP is well controlled 

## 2014-06-10 NOTE — Progress Notes (Signed)
Pre visit review using our clinic review tool, if applicable. No additional management support is needed unless otherwise documented below in the visit note. 

## 2014-06-10 NOTE — Patient Instructions (Signed)
Cough, Adult  A cough is a reflex that helps clear your throat and airways. It can help heal the body or may be a reaction to an irritated airway. A cough may only last 2 or 3 weeks (acute) or may last more than 8 weeks (chronic).  CAUSES Acute cough:  Viral or bacterial infections. Chronic cough:  Infections.  Allergies.  Asthma.  Post-nasal drip.  Smoking.  Heartburn or acid reflux.  Some medicines.  Chronic lung problems (COPD).  Cancer. SYMPTOMS   Cough.  Fever.  Chest pain.  Increased breathing rate.  High-pitched whistling sound when breathing (wheezing).  Colored mucus that you cough up (sputum). TREATMENT   A bacterial cough may be treated with antibiotic medicine.  A viral cough must run its course and will not respond to antibiotics.  Your caregiver may recommend other treatments if you have a chronic cough. HOME CARE INSTRUCTIONS   Only take over-the-counter or prescription medicines for pain, discomfort, or fever as directed by your caregiver. Use cough suppressants only as directed by your caregiver.  Use a cold steam vaporizer or humidifier in your bedroom or home to help loosen secretions.  Sleep in a semi-upright position if your cough is worse at night.  Rest as needed.  Stop smoking if you smoke. SEEK IMMEDIATE MEDICAL CARE IF:   You have pus in your sputum.  Your cough starts to worsen.  You cannot control your cough with suppressants and are losing sleep.  You begin coughing up blood.  You have difficulty breathing.  You develop pain which is getting worse or is uncontrolled with medicine.  You have a fever. MAKE SURE YOU:   Understand these instructions.  Will watch your condition.  Will get help right away if you are not doing well or get worse. Document Released: 03/26/2011 Document Revised: 12/20/2011 Document Reviewed: 03/26/2011 Exit[employer] Patient Information 2015 [employer]. This information is not intended  to replace advice given to you by your health care provider. Make sure you discuss any questions you have with your health care provider.  

## 2014-07-01 ENCOUNTER — Telehealth: Payer: Self-pay | Admitting: Internal Medicine

## 2014-07-01 ENCOUNTER — Other Ambulatory Visit: Payer: Self-pay | Admitting: Internal Medicine

## 2014-07-01 DIAGNOSIS — I2510 Atherosclerotic heart disease of native coronary artery without angina pectoris: Secondary | ICD-10-CM

## 2014-07-01 NOTE — Telephone Encounter (Signed)
Patient states Dr. Ronnald Ramp was to refer him for a test he is not sure what it is.  He has not heard anything yet.

## 2014-07-01 NOTE — Telephone Encounter (Signed)
Unsure of what test he is referring to, will forward for advisment.

## 2014-07-01 NOTE — Telephone Encounter (Signed)
I ordered a Lexiscan cardiac test on 08/31, I ordered it again today

## 2014-07-09 DIAGNOSIS — Z0279 Encounter for issue of other medical certificate: Secondary | ICD-10-CM

## 2014-07-10 NOTE — Telephone Encounter (Signed)
Left message for patient to call office.  

## 2014-07-11 NOTE — Telephone Encounter (Signed)
Pt scheduled for 07/19/14. He is aware.

## 2014-07-19 ENCOUNTER — Ambulatory Visit (HOSPITAL_COMMUNITY): Payer: Medicare Other | Attending: Internal Medicine | Admitting: Radiology

## 2014-07-19 VITALS — BP 136/84 | Ht 72.0 in | Wt 296.0 lb

## 2014-07-19 DIAGNOSIS — R0600 Dyspnea, unspecified: Secondary | ICD-10-CM | POA: Insufficient documentation

## 2014-07-19 DIAGNOSIS — I10 Essential (primary) hypertension: Secondary | ICD-10-CM | POA: Diagnosis not present

## 2014-07-19 DIAGNOSIS — I2510 Atherosclerotic heart disease of native coronary artery without angina pectoris: Secondary | ICD-10-CM

## 2014-07-19 MED ORDER — TECHNETIUM TC 99M SESTAMIBI GENERIC - CARDIOLITE
30.0000 | Freq: Once | INTRAVENOUS | Status: AC | PRN
  Administered 2014-07-19: 30 via INTRAVENOUS

## 2014-07-19 MED ORDER — REGADENOSON 0.4 MG/5ML IV SOLN
0.4000 mg | Freq: Once | INTRAVENOUS | Status: AC
  Administered 2014-07-19: 0.4 mg via INTRAVENOUS

## 2014-07-19 NOTE — Progress Notes (Signed)
[facility] [address] [place], [place] [address] [phone]    Cardiology Nuclear Med Study  [name] is a 73 y.o. male     MRN : [id number]     DOB: [birth year]  Procedure Date: 07/19/2014  Nuclear Med Background Indication for Stress Test: Ischemia and abnormal ekg;DOE History:  No Known CAD  Cardiac Risk Factors: Hypertension  Symptoms:  DOE   Nuclear Pre-Procedure Caffeine/Decaff Intake:  None NPO After: 8:00pm   Lungs:  clear O2 Sat: 94% on room air. IV 0.9% NS with Angio Cath:  22g  IV Site: L Hand  IV Started by:  Ileene Hutchinson, EMT-P  Chest Size (in):  52 Cup Size: n/a  Height: 6' (1.829 m)  Weight:  296 lb (134.265 kg)  BMI:  Body mass index is 40.14 kg/(m^2). Tech Comments:  Took meds this am    Nuclear Med Study 1 or 2 day study: 2 day  Stress Test Type:  Lexiscan  Reading PO:EUMPN Nishan,MD Order Authorizing Provider:  Scarlette Calico  Resting Radionuclide: Technetium 60m Tetrofosmin  Resting Radionuclide Dose: 33.0 mCi on 07/25/2014  Stress Radionuclide:  Technetium 28m Tetrofosmin  Stress Radionuclide Dose: 33.0 mCi on 07/19/2014          Stress Protocol Rest HR: 60 Stress HR: 72  Rest BP: 136/84 Stress BP: 138/101  Exercise Time (min): n/a METS: n/a   Predicted Max HR: 150 bpm % Max HR: 48 bpm Rate Pressure Product: 10152   Dose of Adenosine (mg):  n/a Dose of Lexiscan: 0.4 mg  Dose of Atropine (mg): n/a Dose of Dobutamine: n/a mcg/kg/min (at max HR)  Stress Test Technologist: Ileene Hutchinson, EMT-P  Nuclear Technologist:  Earl Many, CNMT     Rest Procedure:  Myocardial perfusion imaging was performed at rest 45 minutes following the intravenous administration of Technetium 74m Sestamibi. Rest ECG: SR, inferior MI, age undetermined  Stress Procedure:  The patient received IV Lexiscan 0.4 mg over 15-seconds.  Technetium 57m Sestamibi injected at 30-seconds.  Quantitative spect images were obtained after a  45 minute delay. Stress ECG: No significant change from baseline ECG  QPS Raw Data Images:  Normal; no motion artifact; normal heart/lung ratio. Stress Images:  Decreased uptake in the basal inferior and basal and mid inferolateral walls.  Rest Images:  Decreased uptake in the basal inferior and basal and mid inferolateral walls.  Subtraction (SDS):  No evidence of ischemia. Transient Ischemic Dilatation (Normal <1.22):  1.08 Lung/Heart Ratio (Normal <0.45):  0.24  Quantitative Gated Spect Images QGS EDV:  131 ml QGS ESV:  65 ml  Impression Exercise Capacity:  Lexiscan with no exercise. BP Response:  Normal blood pressure response. Clinical Symptoms:  Cough. ECG Impression:  No significant ST segment change suggestive of ischemia. Comparison with Prior Nuclear Study: No previous nuclear study performed  Overall Impression:  Low risk stress nuclear study with an old infarct in the LCX territory and no ischemia. . There is an uptake in the left axilla suspicious for underlying infectious / malignant process that needs to be further evaluated. A chest CT is recommended.   LV Ejection Fraction: 51%.  LV Wall Motion:  Hypokinesis of the basal inferior and inferolteral walls.   [name] 07/25/2014

## 2014-07-25 ENCOUNTER — Ambulatory Visit (HOSPITAL_COMMUNITY): Payer: Medicare Other | Attending: Cardiology | Admitting: Radiology

## 2014-07-25 DIAGNOSIS — R0989 Other specified symptoms and signs involving the circulatory and respiratory systems: Secondary | ICD-10-CM

## 2014-07-25 DIAGNOSIS — R9431 Abnormal electrocardiogram [ECG] [EKG]: Secondary | ICD-10-CM

## 2014-07-25 DIAGNOSIS — R0609 Other forms of dyspnea: Secondary | ICD-10-CM

## 2014-07-25 MED ORDER — TECHNETIUM TC 99M SESTAMIBI GENERIC - CARDIOLITE
30.0000 | Freq: Once | INTRAVENOUS | Status: AC | PRN
  Administered 2014-07-25: 30 via INTRAVENOUS

## 2014-07-29 ENCOUNTER — Other Ambulatory Visit: Payer: Self-pay | Admitting: Internal Medicine

## 2014-08-06 ENCOUNTER — Other Ambulatory Visit: Payer: Self-pay

## 2014-08-06 MED ORDER — FINASTERIDE 5 MG PO TABS: ORAL_TABLET | ORAL | Status: DC

## 2014-08-09 ENCOUNTER — Encounter: Payer: Self-pay | Admitting: Internal Medicine

## 2014-08-09 ENCOUNTER — Ambulatory Visit (INDEPENDENT_AMBULATORY_CARE_PROVIDER_SITE_OTHER): Payer: Medicare Other | Admitting: Internal Medicine

## 2014-08-09 VITALS — BP 130/70 | HR 66 | Temp 98.3°F | Resp 16 | Ht 72.0 in | Wt 299.0 lb

## 2014-08-09 DIAGNOSIS — R05 Cough: Secondary | ICD-10-CM

## 2014-08-09 DIAGNOSIS — R351 Nocturia: Secondary | ICD-10-CM

## 2014-08-09 DIAGNOSIS — J13 Pneumonia due to Streptococcus pneumoniae: Secondary | ICD-10-CM

## 2014-08-09 DIAGNOSIS — R059 Cough, unspecified: Secondary | ICD-10-CM

## 2014-08-09 DIAGNOSIS — I10 Essential (primary) hypertension: Secondary | ICD-10-CM

## 2014-08-09 DIAGNOSIS — N401 Enlarged prostate with lower urinary tract symptoms: Secondary | ICD-10-CM

## 2014-08-09 MED ORDER — PROMETHAZINE-DM 6.25-15 MG/5ML PO SYRP: 5.0000 mL | ORAL_SOLUTION | Freq: Four times a day (QID) | ORAL | Status: DC | PRN

## 2014-08-09 MED ORDER — SILODOSIN 8 MG PO CAPS: 8.0000 mg | ORAL_CAPSULE | Freq: Every day | ORAL | Status: AC

## 2014-08-09 NOTE — Progress Notes (Signed)
Subjective:    [name], male    DOB: 1941-10-06, 73 y.o.   MRN: [id number]  Cough This is a recurrent problem. The current episode started more than 1 month ago. The problem has been gradually improving. The cough is non-productive. Pertinent negatives include no chest pain, chills, ear congestion, ear pain, fever, headaches, heartburn, hemoptysis, myalgias, nasal congestion, postnasal drip, rash, rhinorrhea, sore throat, shortness of breath, sweats, weight loss or wheezing. He has tried prescription cough suppressant for the symptoms. The treatment provided significant relief. There is no history of asthma, bronchiectasis, bronchitis, COPD, emphysema, environmental allergies or pneumonia.      Review of Systems  Constitutional: Negative.  Negative for fever, chills, weight loss, diaphoresis, activity change, appetite change, fatigue and unexpected weight change.  HENT: Negative.  Negative for ear pain, postnasal drip, rhinorrhea, sinus pressure, sore throat and voice change.   Eyes: Negative.   Respiratory: Positive for cough. Negative for apnea, hemoptysis, choking, chest tightness, shortness of breath, wheezing and stridor.   Cardiovascular: Positive for leg swelling. Negative for chest pain and palpitations.  Gastrointestinal: Negative.  Negative for heartburn, nausea, vomiting, abdominal pain, diarrhea, constipation and blood in stool.  Endocrine: Negative.  Negative for polydipsia, polyphagia and polyuria.  Genitourinary: Positive for frequency and difficulty urinating. Negative for dysuria, urgency, hematuria, flank pain, decreased urine volume, discharge, penile swelling, scrotal swelling, enuresis, genital sores, penile pain and testicular pain.  Musculoskeletal: Negative.  Negative for arthralgias, back pain and myalgias.  Skin: Negative.  Negative for rash.  Allergic/Immunologic: Negative.  Negative for environmental allergies.  Neurological: Negative.  Negative  for headaches.  Hematological: Negative.  Negative for adenopathy. Does not bruise/bleed easily.  Psychiatric/Behavioral: Negative.        Objective:   Physical Exam  Vitals reviewed. Constitutional: He is oriented to person, place, and time. He appears well-developed and well-nourished.  Non-toxic appearance. He does not have a sickly appearance. He does not appear ill. No distress.  HENT:  Head: Normocephalic and atraumatic.  Mouth/Throat: Oropharynx is clear and moist. No oropharyngeal exudate.  Eyes: Conjunctivae are normal. Right eye exhibits no discharge. Left eye exhibits no discharge. No scleral icterus.  Neck: Normal range of motion. Neck supple. No JVD present. No tracheal deviation present. No thyromegaly present.  Cardiovascular: Normal rate, regular rhythm, normal heart sounds and intact distal pulses.  Exam reveals no gallop and no friction rub.   No murmur heard. Pulmonary/Chest: Effort normal and breath sounds normal. No stridor. No respiratory distress. He has no wheezes. He has no rales. He exhibits no tenderness.  Abdominal: Soft. Bowel sounds are normal. He exhibits no distension and no mass. There is no tenderness. There is no rebound and no guarding.  Musculoskeletal: Normal range of motion. He exhibits edema (trace edema in BLE). He exhibits no tenderness.  Lymphadenopathy:    He has no cervical adenopathy.  Neurological: He is oriented to person, place, and time.  Skin: Skin is warm and dry. No rash noted. He is not diaphoretic. No erythema. No pallor.  Psychiatric: He has a normal mood and affect. His behavior is normal. Judgment and thought content normal.     Lab Results  Component Value Date   WBC 5.9 12/08/2013   HGB 15.7 12/08/2013   HCT 49.5 12/08/2013   PLT 142* 12/26/2012   GLUCOSE 99 12/04/2013   CHOL 159 12/04/2013   TRIG 175.0* 12/04/2013   HDL 61.00 12/04/2013   LDLDIRECT 96.4 09/19/2007  LDLCALC 63 12/04/2013   ALT 20 12/04/2013   AST 28 12/04/2013     NA 138 12/04/2013   K 4.3 12/04/2013   CL 105 12/04/2013   CREATININE 0.9 05/20/2014   BUN 15 05/20/2014   CO2 26 12/04/2013   TSH 1.61 04/11/2008   PSA 0.43 09/11/2009       Assessment & Plan:

## 2014-08-09 NOTE — Assessment & Plan Note (Signed)
Will add rapaflo for additional symptom relief

## 2014-08-09 NOTE — Assessment & Plan Note (Signed)
Stop amlodipine due to side effects Will recheck his BP in a few months

## 2014-08-09 NOTE — Progress Notes (Signed)
Pre visit review using our clinic review tool, if applicable. No additional management support is needed unless otherwise documented below in the visit note. 

## 2014-08-09 NOTE — Patient Instructions (Signed)

## 2014-08-09 NOTE — Assessment & Plan Note (Signed)
The cough is improving but persists s/p d'c of the ACEI Will cont phenergan-dm as needed Will stop amlodipine as this has been associated with cough

## 2014-08-21 ENCOUNTER — Ambulatory Visit (INDEPENDENT_AMBULATORY_CARE_PROVIDER_SITE_OTHER): Payer: Medicare Other | Admitting: Internal Medicine

## 2014-08-21 ENCOUNTER — Encounter: Payer: Self-pay | Admitting: Internal Medicine

## 2014-08-21 ENCOUNTER — Other Ambulatory Visit (INDEPENDENT_AMBULATORY_CARE_PROVIDER_SITE_OTHER): Payer: Medicare Other

## 2014-08-21 VITALS — BP 140/90 | HR 66 | Temp 98.7°F | Resp 16 | Ht 72.0 in | Wt 293.0 lb

## 2014-08-21 DIAGNOSIS — Z23 Encounter for immunization: Secondary | ICD-10-CM

## 2014-08-21 DIAGNOSIS — N401 Enlarged prostate with lower urinary tract symptoms: Secondary | ICD-10-CM

## 2014-08-21 DIAGNOSIS — E785 Hyperlipidemia, unspecified: Secondary | ICD-10-CM

## 2014-08-21 DIAGNOSIS — I10 Essential (primary) hypertension: Secondary | ICD-10-CM

## 2014-08-21 DIAGNOSIS — R351 Nocturia: Secondary | ICD-10-CM

## 2014-08-21 DIAGNOSIS — Z0000 Encounter for general adult medical examination without abnormal findings: Secondary | ICD-10-CM

## 2014-08-21 LAB — LIPID PANEL
CHOL/HDL RATIO: 3
Cholesterol: 148 mg/dL (ref 0–200)
HDL: 49.2 mg/dL (ref >39.00)
LDL Cholesterol: 66 mg/dL (ref 0–99)
NonHDL: 98.8
Triglycerides: 164 mg/dL — ABNORMAL HIGH (ref 0.0–149.0)
VLDL: 32.8 mg/dL (ref 0.0–40.0)

## 2014-08-21 LAB — CBC WITH DIFFERENTIAL/PLATELET
BASOS ABS: 0 10*3/uL (ref 0.0–0.1)
Basophils Relative: 0.5 % (ref 0.0–3.0)
Eosinophils Absolute: 0.2 10*3/uL (ref 0.0–0.7)
Eosinophils Relative: 3.1 % (ref 0.0–5.0)
HCT: 47.1 % (ref 39.0–52.0)
Hemoglobin: 15.6 g/dL (ref 13.0–17.0)
LYMPHS ABS: 3.5 10*3/uL (ref 0.7–4.0)
LYMPHS PCT: 48.4 % — AB (ref 12.0–46.0)
MCHC: 33.2 g/dL (ref 30.0–36.0)
MCV: 92.8 fl (ref 78.0–100.0)
MONOS PCT: 8.6 % (ref 3.0–12.0)
Monocytes Absolute: 0.6 10*3/uL (ref 0.1–1.0)
Neutro Abs: 2.9 10*3/uL (ref 1.4–7.7)
Neutrophils Relative %: 39.4 % — ABNORMAL LOW (ref 43.0–77.0)
Platelets: 176 10*3/uL (ref 150.0–400.0)
RBC: 5.08 Mil/uL (ref 4.22–5.81)
RDW: 13.8 % (ref 11.5–15.5)
WBC: 7.3 10*3/uL (ref 4.0–10.5)

## 2014-08-21 LAB — COMPREHENSIVE METABOLIC PANEL
ALT: 19 U/L (ref 0–53)
AST: 30 U/L (ref 0–37)
Albumin: 3.6 g/dL (ref 3.5–5.2)
Alkaline Phosphatase: 68 U/L (ref 39–117)
BUN: 11 mg/dL (ref 6–23)
CALCIUM: 9.7 mg/dL (ref 8.4–10.5)
CHLORIDE: 108 meq/L (ref 96–112)
CO2: 22 meq/L (ref 19–32)
CREATININE: 1 mg/dL (ref 0.4–1.5)
GFR: 83.07 mL/min (ref >60.00)
Glucose, Bld: 111 mg/dL — ABNORMAL HIGH (ref 70–99)
POTASSIUM: 4.2 meq/L (ref 3.5–5.1)
SODIUM: 141 meq/L (ref 135–145)
TOTAL PROTEIN: 7.4 g/dL (ref 6.0–8.3)
Total Bilirubin: 0.6 mg/dL (ref 0.2–1.2)

## 2014-08-21 LAB — FECAL OCCULT BLOOD, GUAIAC: FECAL OCCULT BLD: NEGATIVE

## 2014-08-21 LAB — PSA: PSA: 0.38 ng/mL (ref 0.10–4.00)

## 2014-08-21 LAB — TSH: TSH: 1.87 u[IU]/mL (ref 0.35–4.50)

## 2014-08-21 NOTE — Assessment & Plan Note (Signed)
I will recheck his FLP today and will treat if needed

## 2014-08-21 NOTE — Progress Notes (Deleted)
   Subjective:    [name], male    DOB: Jul 25, 1941, 73 y.o.   MRN: [id number]  Hypertension      Review of Systems     Objective:   Physical Exam   Lab Results  Component Value Date   WBC 5.9 12/08/2013   HGB 15.7 12/08/2013   HCT 49.5 12/08/2013   PLT 142* 12/26/2012   GLUCOSE 99 12/04/2013   CHOL 159 12/04/2013   TRIG 175.0* 12/04/2013   HDL 61.00 12/04/2013   LDLDIRECT 96.4 09/19/2007   LDLCALC 63 12/04/2013   ALT 20 12/04/2013   AST 28 12/04/2013   NA 138 12/04/2013   K 4.3 12/04/2013   CL 105 12/04/2013   CREATININE 0.9 05/20/2014   BUN 15 05/20/2014   CO2 26 12/04/2013   TSH 1.61 04/11/2008   PSA 0.43 09/11/2009       Assessment & Plan:

## 2014-08-21 NOTE — Assessment & Plan Note (Signed)
His BP is adequately well controlled Will cont to monitor for now but will not start a med

## 2014-08-21 NOTE — Progress Notes (Signed)
Subjective:    [name], male    DOB: Feb 19, 1941, 73 y.o.   MRN: [id number]  Hypertension This is a chronic problem. The current episode started today. The problem is unchanged. The problem is controlled. Pertinent negatives include no anxiety, blurred vision, chest pain, headaches, malaise/fatigue, neck pain, orthopnea, palpitations, peripheral edema, PND, shortness of breath or sweats. Past treatments include lifestyle changes. The current treatment provides moderate improvement. Compliance problems include diet and exercise.       Review of Systems  Constitutional: Negative.  Negative for fever, chills, malaise/fatigue, diaphoresis, appetite change and fatigue.  HENT: Negative.   Eyes: Negative.  Negative for blurred vision.  Respiratory: Negative.  Negative for cough, choking, chest tightness, shortness of breath and stridor.   Cardiovascular: Negative.  Negative for chest pain, palpitations, orthopnea, leg swelling and PND.  Gastrointestinal: Negative.  Negative for nausea, vomiting, abdominal pain, diarrhea, constipation and blood in stool.  Endocrine: Negative.   Genitourinary: Negative.   Musculoskeletal: Negative.  Negative for neck pain.  Skin: Negative.   Allergic/Immunologic: Negative.   Neurological: Negative.  Negative for dizziness, tremors, seizures, syncope, numbness and headaches.  Hematological: Negative.  Negative for adenopathy. Does not bruise/bleed easily.  Psychiatric/Behavioral: Negative.        Objective:   Physical Exam  Constitutional: He is oriented to person, place, and time. He appears well-developed and well-nourished. No distress.  HENT:  Head: Normocephalic and atraumatic.  Mouth/Throat: Oropharynx is clear and moist. No oropharyngeal exudate.  Eyes: Conjunctivae are normal. Right eye exhibits no discharge. Left eye exhibits no discharge. No scleral icterus.  Neck: Normal range of motion. Neck supple. No JVD present. No  tracheal deviation present. No thyromegaly present.  Cardiovascular: Normal rate, regular rhythm, normal heart sounds and intact distal pulses.  Exam reveals no gallop and no friction rub.   No murmur heard. Pulmonary/Chest: Effort normal and breath sounds normal. No stridor. No respiratory distress. He has no wheezes. He has no rales. He exhibits no tenderness.  Abdominal: Soft. Bowel sounds are normal. He exhibits no distension and no mass. There is no tenderness. There is no rebound and no guarding. Hernia confirmed negative in the right inguinal area and confirmed negative in the left inguinal area.  Genitourinary: Rectum normal, prostate normal, testes normal and penis normal. Rectal exam shows no external hemorrhoid, no internal hemorrhoid, no fissure, no mass, no tenderness and anal tone normal. Guaiac negative stool. Prostate is not enlarged and not tender. Right testis shows no mass, no swelling and no tenderness. Right testis is descended. Left testis shows no mass, no swelling and no tenderness. Left testis is descended. Uncircumcised. No phimosis, paraphimosis, hypospadias, penile erythema or penile tenderness. No discharge found.  Musculoskeletal: Normal range of motion. He exhibits no edema or tenderness.  Lymphadenopathy:    He has no cervical adenopathy.       Right: No inguinal adenopathy present.       Left: No inguinal adenopathy present.  Neurological: He is oriented to person, place, and time.  Skin: Skin is warm and dry. No rash noted. He is not diaphoretic. No erythema. No pallor.  Psychiatric: He has a normal mood and affect. His behavior is normal. Judgment and thought content normal.  Vitals reviewed.    Lab Results  Component Value Date   WBC 5.9 12/08/2013   HGB 15.7 12/08/2013   HCT 49.5 12/08/2013   PLT 142* 12/26/2012   GLUCOSE 99 12/04/2013   CHOL  159 12/04/2013   TRIG 175.0* 12/04/2013   HDL 61.00 12/04/2013   LDLDIRECT 96.4 09/19/2007   LDLCALC 63  12/04/2013   ALT 20 12/04/2013   AST 28 12/04/2013   NA 138 12/04/2013   K 4.3 12/04/2013   CL 105 12/04/2013   CREATININE 0.9 05/20/2014   BUN 15 05/20/2014   CO2 26 12/04/2013   TSH 1.61 04/11/2008   PSA 0.43 09/11/2009       Assessment & Plan:

## 2014-08-21 NOTE — Progress Notes (Signed)
Pre visit review using our clinic review tool, if applicable. No additional management support is needed unless otherwise documented below in the visit note. 

## 2014-08-21 NOTE — Assessment & Plan Note (Signed)

## 2014-08-21 NOTE — Patient Instructions (Signed)
Health Maintenance A healthy lifestyle and preventative care can promote health and wellness.  Maintain regular health, dental, and eye exams.  Eat a healthy diet. Foods like vegetables, fruits, whole grains, low-fat dairy products, and lean protein foods contain the nutrients you need and are low in calories. Decrease your intake of foods high in solid fats, added sugars, and salt. Get information about a proper diet from your health care provider, if necessary.  Regular physical exercise is one of the most important things you can do for your health. Most adults should get at least 150 minutes of moderate-intensity exercise (any activity that increases your heart rate and causes you to sweat) each week. In addition, most adults need muscle-strengthening exercises on 2 or more days a week.   Maintain a healthy weight. The body mass index (BMI) is a screening tool to identify possible weight problems. It provides an estimate of body fat based on height and weight. Your health care provider can find your BMI and can help you achieve or maintain a healthy weight. For males 20 years and older:  A BMI below 18.5 is considered underweight.  A BMI of 18.5 to 24.9 is normal.  A BMI of 25 to 29.9 is considered overweight.  A BMI of 30 and above is considered obese.  Maintain normal blood lipids and cholesterol by exercising and minimizing your intake of saturated fat. Eat a balanced diet with plenty of fruits and vegetables. Blood tests for lipids and cholesterol should begin at age 20 and be repeated every 5 years. If your lipid or cholesterol levels are high, you are over age 50, or you are at high risk for heart disease, you may need your cholesterol levels checked more frequently.Ongoing high lipid and cholesterol levels should be treated with medicines if diet and exercise are not working.  If you smoke, find out from your health care provider how to quit. If you do not use tobacco, do not  start.  Lung cancer screening is recommended for adults aged 55-80 years who are at high risk for developing lung cancer because of a history of smoking. A yearly low-dose CT scan of the lungs is recommended for people who have at least a 30-pack-year history of smoking and are current smokers or have quit within the past 15 years. A pack year of smoking is smoking an average of 1 pack of cigarettes a day for 1 year (for example, a 30-pack-year history of smoking could mean smoking 1 pack a day for 30 years or 2 packs a day for 15 years). Yearly screening should continue until the smoker has stopped smoking for at least 15 years. Yearly screening should be stopped for people who develop a health problem that would prevent them from having lung cancer treatment.  If you choose to drink alcohol, do not have more than 2 drinks per day. One drink is considered to be 12 oz (360 mL) of beer, 5 oz (150 mL) of wine, or 1.5 oz (45 mL) of liquor.  Avoid the use of street drugs. Do not share needles with anyone. Ask for help if you need support or instructions about stopping the use of drugs.  High blood pressure causes heart disease and increases the risk of stroke. Blood pressure should be checked at least every 1-2 years. Ongoing high blood pressure should be treated with medicines if weight loss and exercise are not effective.  If you are 45-79 years old, ask your health care provider if   you should take aspirin to prevent heart disease.  Diabetes screening involves taking a blood sample to check your fasting blood sugar level. This should be done once every 3 years after age 45 if you are at a normal weight and without risk factors for diabetes. Testing should be considered at a younger age or be carried out more frequently if you are overweight and have at least 1 risk factor for diabetes.  Colorectal cancer can be detected and often prevented. Most routine colorectal cancer screening begins at the age of 50  and continues through age 75. However, your health care provider may recommend screening at an earlier age if you have risk factors for colon cancer. On a yearly basis, your health care provider may provide home test kits to check for hidden blood in the stool. A small camera at the end of a tube may be used to directly examine the colon (sigmoidoscopy or colonoscopy) to detect the earliest forms of colorectal cancer. Talk to your health care provider about this at age 50 when routine screening begins. A direct exam of the colon should be repeated every 5-10 years through age 75, unless early forms of precancerous polyps or small growths are found.  People who are at an increased risk for hepatitis B should be screened for this virus. You are considered at high risk for hepatitis B if:  You were born in a country where hepatitis B occurs often. Talk with your health care provider about which countries are considered high risk.  Your parents were born in a high-risk country and you have not received a shot to protect against hepatitis B (hepatitis B vaccine).  You have HIV or AIDS.  You use needles to inject street drugs.  You live with, or have sex with, someone who has hepatitis B.  You are a man who has sex with other men (MSM).  You get hemodialysis treatment.  You take certain medicines for conditions like cancer, organ transplantation, and autoimmune conditions.  Hepatitis C blood testing is recommended for all people born from 1945 through 1965 and any individual with known risk factors for hepatitis C.  Healthy men should no longer receive prostate-specific antigen (PSA) blood tests as part of routine cancer screening. Talk to your health care provider about prostate cancer screening.  Testicular cancer screening is not recommended for adolescents or adult males who have no symptoms. Screening includes self-exam, a health care provider exam, and other screening tests. Consult with your  health care provider about any symptoms you have or any concerns you have about testicular cancer.  Practice safe sex. Use condoms and avoid high-risk sexual practices to reduce the spread of sexually transmitted infections (STIs).  You should be screened for STIs, including gonorrhea and chlamydia if:  You are sexually active and are younger than 24 years.  You are older than 24 years, and your health care provider tells you that you are at risk for this type of infection.  Your sexual activity has changed since you were last screened, and you are at an increased risk for chlamydia or gonorrhea. Ask your health care provider if you are at risk.  If you are at risk of being infected with HIV, it is recommended that you take a prescription medicine daily to prevent HIV infection. This is called pre-exposure prophylaxis (PrEP). You are considered at risk if:  You are a man who has sex with other men (MSM).  You are a heterosexual man who   is sexually active with multiple partners.  You take drugs by injection.  You are sexually active with a partner who has HIV.  Talk with your health care provider about whether you are at high risk of being infected with HIV. If you choose to begin PrEP, you should first be tested for HIV. You should then be tested every 3 months for as long as you are taking PrEP.  Use sunscreen. Apply sunscreen liberally and repeatedly throughout the day. You should seek shade when your shadow is shorter than you. Protect yourself by wearing long sleeves, pants, a wide-brimmed hat, and sunglasses year round whenever you are outdoors.  Tell your health care provider of new moles or changes in moles, especially if there is a change in shape or color. Also, tell your health care provider if a mole is larger than the size of a pencil eraser.  A one-time screening for abdominal aortic aneurysm (AAA) and surgical repair of large AAAs by ultrasound is recommended for men aged  65-75 years who are current or former smokers.  Stay current with your vaccines (immunizations). Document Released: 03/25/2008 Document Revised: 10/02/2013 Document Reviewed: 02/22/2011 ExitCare Patient Information 2015 [employer]. This information is not intended to replace advice given to you by your health care provider. Make sure you discuss any questions you have with your health care provider.  Hypertension Hypertension, commonly called high blood pressure, is when the force of blood pumping through your arteries is too strong. Your arteries are the blood vessels that carry blood from your heart throughout your body. A blood pressure reading consists of a higher number over a lower number, such as 110/72. The higher number (systolic) is the pressure inside your arteries when your heart pumps. The lower number (diastolic) is the pressure inside your arteries when your heart relaxes. Ideally you want your blood pressure below 120/80. Hypertension forces your heart to work harder to pump blood. Your arteries may become narrow or stiff. Having hypertension puts you at risk for heart disease, stroke, and other problems.  RISK FACTORS Some risk factors for high blood pressure are controllable. Others are not.  Risk factors you cannot control include:   Race. You may be at higher risk if you are African American.  Age. Risk increases with age.  Gender. Men are at higher risk than women before age 45 years. After age 65, women are at higher risk than men. Risk factors you can control include:  Not getting enough exercise or physical activity.  Being overweight.  Getting too much fat, sugar, calories, or salt in your diet.  Drinking too much alcohol. SIGNS AND SYMPTOMS Hypertension does not usually cause signs or symptoms. Extremely high blood pressure (hypertensive crisis) may cause headache, anxiety, shortness of breath, and nosebleed. DIAGNOSIS  To check if you have hypertension,  your health care provider will measure your blood pressure while you are seated, with your arm held at the level of your heart. It should be measured at least twice using the same arm. Certain conditions can cause a difference in blood pressure between your right and left arms. A blood pressure reading that is higher than normal on one occasion does not mean that you need treatment. If one blood pressure reading is high, ask your health care provider about having it checked again. TREATMENT  Treating high blood pressure includes making lifestyle changes and possibly taking medicine. Living a healthy lifestyle can help lower high blood pressure. You may need to change   some of your habits. Lifestyle changes may include:  Following the DASH diet. This diet is high in fruits, vegetables, and whole grains. It is low in salt, red meat, and added sugars.  Getting at least 2 hours of brisk physical activity every week.  Losing weight if necessary.  Not smoking.  Limiting alcoholic beverages.  Learning ways to reduce stress. If lifestyle changes are not enough to get your blood pressure under control, your health care provider may prescribe medicine. You may need to take more than one. Work closely with your health care provider to understand the risks and benefits. HOME CARE INSTRUCTIONS  Have your blood pressure rechecked as directed by your health care provider.   Take medicines only as directed by your health care provider. Follow the directions carefully. Blood pressure medicines must be taken as prescribed. The medicine does not work as well when you skip doses. Skipping doses also puts you at risk for problems.   Do not smoke.   Monitor your blood pressure at home as directed by your health care provider. SEEK MEDICAL CARE IF:   You think you are having a reaction to medicines taken.  You have recurrent headaches or feel dizzy.  You have swelling in your ankles.  You have  trouble with your vision. SEEK IMMEDIATE MEDICAL CARE IF:  You develop a severe headache or confusion.  You have unusual weakness, numbness, or feel faint.  You have severe chest or abdominal pain.  You vomit repeatedly.  You have trouble breathing. MAKE SURE YOU:   Understand these instructions.  Will watch your condition.  Will get help right away if you are not doing well or get worse. Document Released: 09/27/2005 Document Revised: 02/11/2014 Document Reviewed: 07/20/2013 ExitCare Patient Information 2015 [employer]. This information is not intended to replace advice given to you by your health care provider. Make sure you discuss any questions you have with your health care provider.  

## 2014-08-22 NOTE — Addendum Note (Signed)
Addended by: Estell Harpin T on: 08/22/2014 08:27 AM   Modules accepted: Orders

## 2014-09-24 ENCOUNTER — Telehealth: Payer: Self-pay

## 2014-09-24 NOTE — Telephone Encounter (Signed)
  Discontinue Date:  05/10/2014 [place]:  Janith Lima, MD Discontinue Reason:  Side effect (s)   Written Date:  02/19/14 Expiration Date:  02/19/15     Start Date:  02/19/14 End Date:  05/10/14     Ordering Provider:  -- Authorizing Provider:  Janith Lima, MD Ordering User:  Janith Lima, MD     [name] ([id number])  Unable to reach patient  Quinapril 10 mg o 95% adherent as of 07/10/14 o Last fill date 05-10-14  ACTION ITEMS: o Verify patient is still to be taking per provider. o If so, contact patient to empower him to refill and take his medication as prescribed.

## 2014-10-29 ENCOUNTER — Telehealth: Payer: Self-pay | Admitting: Internal Medicine

## 2014-10-29 NOTE — Telephone Encounter (Signed)
Pt called requesting a phone call, prefers to talk to assistant.  [phone]

## 2014-10-29 NOTE — Telephone Encounter (Signed)
Patient needed to confirm the amlodipine was d/c per MD. I advised that medication was d/c at 08/09/14 office visit due to cough.

## 2014-12-04 ENCOUNTER — Other Ambulatory Visit: Payer: Self-pay | Admitting: *Deleted

## 2014-12-04 ENCOUNTER — Other Ambulatory Visit: Payer: Self-pay | Admitting: Internal Medicine

## 2014-12-04 MED ORDER — SIMVASTATIN 40 MG PO TABS: 40.0000 mg | ORAL_TABLET | Freq: Every day | ORAL | Status: DC

## 2014-12-04 NOTE — Telephone Encounter (Signed)
Rx faxed to [facility]

## 2015-01-02 DIAGNOSIS — G4733 Obstructive sleep apnea (adult) (pediatric): Secondary | ICD-10-CM | POA: Diagnosis not present

## 2015-01-13 ENCOUNTER — Telehealth: Payer: Self-pay | Admitting: Internal Medicine

## 2015-01-13 MED ORDER — EZETIMIBE 10 MG PO TABS: 10.0000 mg | ORAL_TABLET | Freq: Every day | ORAL | Status: DC

## 2015-01-13 NOTE — Telephone Encounter (Signed)
Done

## 2015-01-13 NOTE — Telephone Encounter (Signed)
Pt wife called in and requesting refill for his Zetia and needs to be called in to [facility] at [address] 90 day supply  [facility] had given this to him last year.  Not sure if this could be refilled without an appt?

## 2015-01-17 DIAGNOSIS — H4011X1 Primary open-angle glaucoma, mild stage: Secondary | ICD-10-CM | POA: Diagnosis not present

## 2015-02-10 ENCOUNTER — Other Ambulatory Visit: Payer: Self-pay | Admitting: Internal Medicine

## 2015-02-14 ENCOUNTER — Telehealth: Payer: Self-pay | Admitting: Internal Medicine

## 2015-02-14 NOTE — Telephone Encounter (Signed)
Please call patient. He wanted to discuss his next appointment with you.

## 2015-02-17 NOTE — Telephone Encounter (Signed)
Returned call to patient and advised appt is for 6 month follow up to readdress blood pressure and cholesterol/ Patient states that he has white coat syndrome, reports that BP is checked at home and averages 124/80. He request to cancel upcoming appt and will call if needed.

## 2015-02-24 ENCOUNTER — Ambulatory Visit: Payer: Medicare Other | Admitting: Internal Medicine

## 2015-05-09 ENCOUNTER — Other Ambulatory Visit: Payer: Self-pay | Admitting: Internal Medicine

## 2015-05-16 ENCOUNTER — Telehealth: Payer: Self-pay | Admitting: *Deleted

## 2015-05-16 MED ORDER — AMLODIPINE BESYLATE 5 MG PO TABS: 5.0000 mg | ORAL_TABLET | Freq: Every day | ORAL | Status: DC

## 2015-05-16 MED ORDER — ALPRAZOLAM 0.5 MG PO TABS: 0.5000 mg | ORAL_TABLET | Freq: Every evening | ORAL | Status: DC | PRN

## 2015-05-16 NOTE — Telephone Encounter (Signed)
One Done hardcopy to [facility]

## 2015-05-16 NOTE — Telephone Encounter (Signed)
Left msg on triage needing to get refill on amlodipine & alprazolam. Sent maintenance med pls advise on control. MD is out of office...Johny Chess

## 2015-05-16 NOTE — Telephone Encounter (Signed)
Notified pt wife rx for alprazolam fax to pharmacy...[name]

## 2015-05-20 ENCOUNTER — Telehealth: Payer: Self-pay | Admitting: Internal Medicine

## 2015-05-20 NOTE — Telephone Encounter (Signed)
Patients wife states that zetia is not covered anymore by medicare.  Would like something else sent to [facility] on [address].  Wife also states that patients is experiencing some dizzy spells and would like to know if this could be a side effect of zetia.

## 2015-05-22 NOTE — Telephone Encounter (Signed)
Zetia is unique in its class and there is no other alternative. I've never heard of Zetia causing dizziness. If he feels poorly he needs to be seen.

## 2015-05-22 NOTE — Telephone Encounter (Signed)
PT is scheduled to come in on 8/16. PA will be initiated for the Zetia.

## 2015-05-23 NOTE — Telephone Encounter (Signed)
Received fax from [place] stating the medication is on the plans list of covered drugs. I called the pharmacy and pharmacy stated #90 is 379.00 and #30 is 130.00. I spoke with pt and they cannot afford it at that price. He is coming in on Tuesday and said he would like to talk about maybe switching to something else that would be more cost efficient.

## 2015-05-23 NOTE — Telephone Encounter (Signed)
Pa initiated KEY: [id number]

## 2015-05-27 ENCOUNTER — Telehealth: Payer: Self-pay | Admitting: Internal Medicine

## 2015-05-27 ENCOUNTER — Encounter: Payer: Self-pay | Admitting: Internal Medicine

## 2015-05-27 ENCOUNTER — Ambulatory Visit (INDEPENDENT_AMBULATORY_CARE_PROVIDER_SITE_OTHER): Payer: Medicare Other | Admitting: Internal Medicine

## 2015-05-27 VITALS — BP 148/70 | HR 58 | Temp 98.1°F | Resp 16 | Ht 71.0 in | Wt 305.0 lb

## 2015-05-27 DIAGNOSIS — H8113 Benign paroxysmal vertigo, bilateral: Secondary | ICD-10-CM | POA: Diagnosis not present

## 2015-05-27 DIAGNOSIS — I10 Essential (primary) hypertension: Secondary | ICD-10-CM | POA: Diagnosis not present

## 2015-05-27 DIAGNOSIS — H8110 Benign paroxysmal vertigo, unspecified ear: Secondary | ICD-10-CM | POA: Insufficient documentation

## 2015-05-27 MED ORDER — MECLIZINE HCL 25 MG PO TABS: 25.0000 mg | ORAL_TABLET | Freq: Three times a day (TID) | ORAL | Status: DC | PRN

## 2015-05-27 MED ORDER — AMLODIPINE BESYLATE 5 MG PO TABS: 5.0000 mg | ORAL_TABLET | Freq: Every day | ORAL | Status: DC

## 2015-05-27 NOTE — Patient Instructions (Signed)

## 2015-05-27 NOTE — Telephone Encounter (Signed)
Patient called to request advice on amplodipine. He wants to make sure that he is actually supposed to keep taking it even though he has an allergy. Please call him back to clarify

## 2015-05-27 NOTE — Telephone Encounter (Signed)
Pt informed

## 2015-05-27 NOTE — Progress Notes (Signed)
Subjective:  [name], male    DOB: 05-18-1941  Age: 74 y.o. MRN: [id number]  CC: Dizziness and Hypertension   HPI DEMETRIA LIGHTSEY presents for two-week history of intermittent vertigo. He states that when he lays down and puts his head on a pillow he feels a spinning sensation throughout his head. He has had this before over the last few years. He was previously treated for vertigo. He has had a few episodes of ataxia but denies headache, blurred vision, loss of hearing, numbness, weakness, tingling, nausea or vomiting.  Outpatient Prescriptions Prior to Visit  Medication Sig Dispense Refill  . ALPRAZolam (XANAX) 0.5 MG tablet Take 1 tablet (0.5 mg total) by mouth at bedtime as needed for anxiety. 90 tablet 0  . aspirin EC 81 MG tablet Take 81 mg by mouth every evening.    Marland Kitchen b complex vitamins tablet Take 1 tablet by mouth daily.      . Calcium Carbonate-Vitamin D (CALCIUM 600 + D PO) Take 1 tablet by mouth daily.    . cholecalciferol (VITAMIN D) 1000 UNITS tablet Take 1,000 Units by mouth daily.    . diphenhydrAMINE (BENADRYL) 25 MG tablet Take 25 mg by mouth at bedtime as needed for allergies. Generic Allergy medication from [facility]    . dorzolamide (TRUSOPT) 2 % ophthalmic solution 1 drop 2 (two) times daily.    Marland Kitchen ezetimibe (ZETIA) 10 MG tablet Take 1 tablet (10 mg total) by mouth daily. 90 tablet 3  . finasteride (PROSCAR) 5 MG tablet TAKE 1 TABLET BY MOUTH DAILY 90 tablet 3  . Garlic 8588 MG CAPS Take 1,000 mg by mouth daily.    . Multiple Vitamin (MULTIVITAMIN WITH MINERALS) TABS Take 1 tablet by mouth daily.    . Omega-3 Fatty Acids (FISH OIL) 1000 MG CAPS Take 1,000 mg by mouth daily.    Marland Kitchen omeprazole (PRILOSEC) 20 MG capsule Take 20 mg by mouth daily.    . silodosin (RAPAFLO) 8 MG CAPS capsule Take 1 capsule (8 mg total) by mouth daily with breakfast. 30 capsule 11  . simvastatin (ZOCOR) 40 MG tablet Take 1 tablet (40 mg total) by mouth at bedtime. 90 tablet 1    . venlafaxine XR (EFFEXOR-XR) 150 MG 24 hr capsule TAKE ONE CAPSULE BY MOUTH EVERY DAY 90 capsule 3  . vitamin C (ASCORBIC ACID) 500 MG tablet Take 500 mg by mouth daily.    . Zinc 50 MG TABS Take 50 mg by mouth daily.    Marland Kitchen amLODipine (NORVASC) 5 MG tablet Take 1 tablet (5 mg total) by mouth daily. 90 tablet 0  . promethazine-dextromethorphan (PROMETHAZINE-DM) 6.25-15 MG/5ML syrup Take 5 mLs by mouth 4 (four) times daily as needed for cough. 118 mL 0   No facility-administered medications prior to visit.    ROS Review of Systems  Constitutional: Negative.  Negative for fever, chills, diaphoresis, appetite change and fatigue.  HENT: Negative.  Negative for ear pain, hearing loss, sinus pressure, trouble swallowing and voice change.   Eyes: Negative.   Respiratory: Negative.  Negative for cough, choking, chest tightness, shortness of breath and stridor.   Cardiovascular: Negative.  Negative for chest pain, palpitations and leg swelling.  Gastrointestinal: Negative.  Negative for nausea, vomiting, abdominal pain, diarrhea, constipation and blood in stool.  Endocrine: Negative.   Genitourinary: Negative.   Musculoskeletal: Negative.  Negative for myalgias, back pain, arthralgias and neck pain.  Skin: Negative.  Negative for rash.  Allergic/Immunologic: Negative.  Neurological: Positive for dizziness. Negative for tremors, seizures, syncope, facial asymmetry, speech difficulty, weakness, light-headedness, numbness and headaches.  Hematological: Negative.  Negative for adenopathy. Does not bruise/bleed easily.  Psychiatric/Behavioral: Negative.     Objective:  BP 148/70 mmHg  Pulse 58  Temp(Src) 98.1 F (36.7 C) (Oral)  Resp 16  Ht 5\' 11"  (1.803 m)  Wt 305 lb (138.347 kg)  BMI 42.56 kg/m2  SpO2 93%  BP Readings from Last 3 Encounters:  05/27/15 148/70  08/21/14 140/90  08/09/14 130/70    Wt Readings from Last 3 Encounters:  05/27/15 305 lb (138.347 kg)  08/21/14 293 lb  (132.904 kg)  08/09/14 299 lb (135.626 kg)    Physical Exam  Constitutional: He is oriented to person, place, and time. He appears well-developed and well-nourished. No distress.  HENT:  Head: Normocephalic and atraumatic.  Mouth/Throat: Oropharynx is clear and moist. No oropharyngeal exudate.  Eyes: Conjunctivae and EOM are normal. Pupils are equal, round, and reactive to light. Right eye exhibits no discharge. Left eye exhibits no discharge. No scleral icterus.  Neck: Normal range of motion. Neck supple. No JVD present. No tracheal deviation present. No thyromegaly present.  Cardiovascular: Normal rate, regular rhythm, normal heart sounds and intact distal pulses.  Exam reveals no gallop and no friction rub.   No murmur heard. Pulmonary/Chest: Effort normal and breath sounds normal. No stridor. No respiratory distress. He has no wheezes. He has no rales. He exhibits no tenderness.  Abdominal: Soft. Bowel sounds are normal. He exhibits no distension and no mass. There is no tenderness. There is no rebound and no guarding.  Musculoskeletal: Normal range of motion. He exhibits no edema or tenderness.  Lymphadenopathy:    He has no cervical adenopathy.  Neurological: He is oriented to person, place, and time. He has normal strength. He displays no atrophy, no tremor and normal reflexes. No cranial nerve deficit or sensory deficit. He exhibits normal muscle tone. He displays a negative Romberg sign. He displays no seizure activity. Coordination and gait normal. He displays no Babinski's sign on the right side. He displays no Babinski's sign on the left side.  Reflex Scores:      Tricep reflexes are 1+ on the right side and 1+ on the left side.      Bicep reflexes are 1+ on the right side and 1+ on the left side.      Brachioradialis reflexes are 1+ on the right side and 1+ on the left side.      Patellar reflexes are 1+ on the right side and 1+ on the left side.      Achilles reflexes are 1+ on  the right side and 1+ on the left side. Skin: Skin is warm and dry. No rash noted. He is not diaphoretic. No erythema. No pallor.  Psychiatric: He has a normal mood and affect. His behavior is normal. Judgment and thought content normal.  Vitals reviewed.   Lab Results  Component Value Date   WBC 7.3 08/21/2014   HGB 15.6 08/21/2014   HCT 47.1 08/21/2014   PLT 176.0 08/21/2014   GLUCOSE 111* 08/21/2014   CHOL 148 08/21/2014   TRIG 164.0* 08/21/2014   HDL 49.20 08/21/2014   LDLDIRECT 96.4 09/19/2007   LDLCALC 66 08/21/2014   ALT 19 08/21/2014   AST 30 08/21/2014   NA 141 08/21/2014   K 4.2 08/21/2014   CL 108 08/21/2014   CREATININE 1.0 08/21/2014   BUN 11 08/21/2014  CO2 22 08/21/2014   TSH 1.87 08/21/2014   PSA 0.38 08/21/2014    Ct Chest W Contrast  05/21/2014   CLINICAL DATA:  Chronic productive cough.  History of pneumonia.  EXAM: CT CHEST WITH CONTRAST  TECHNIQUE: Multidetector CT imaging of the chest was performed during intravenous contrast administration.  CONTRAST:  80 mL OMNIPAQUE IOHEXOL 300 MG/ML  SOLN  COMPARISON:  PA and lateral chest 05/10/2014 and 12/26/2012.  FINDINGS: Calcific aortic and coronary atherosclerosis is noted. There is no pleural or pericardial effusion. Heart size is normal. No axillary, hilar or mediastinal lymphadenopathy is identified. Lungs demonstrate punctate calcified granuloma in the right upper lobe. Minimal dependent atelectasis is also seen. The lungs are otherwise clear. Airways are unremarkable.  Visualized upper abdomen demonstrates partial visualization of a cystic lesion in the left kidney. The patient is status post cholecystectomy. No lytic or sclerotic bony lesion is identified.  IMPRESSION: No acute finding. The lungs are clear except for a punctate calcified granuloma seen in the right upper lobe. No finding to explain the patient's cough is identified.  Calcific aortic and coronary atherosclerosis.   Electronically Signed   By:  Inge Rise M.D.   On: 05/21/2014 10:25    Assessment & Plan:   Deroy was seen today for dizziness and hypertension.  Diagnoses and all orders for this visit:  Benign paroxysmal positional vertigo, bilateral- other than vertigo and dizziness he does not have any dangerous sounding CNS symptoms. His neurologic exam is normal. Will treat with Antivert. Also gave him patient education material. -     meclizine (ANTIVERT) 25 MG tablet; Take 1 tablet (25 mg total) by mouth 3 (three) times daily as needed for dizziness.  Essential hypertension- his blood pressure is well-controlled. He has a previous history of allergy to amlodipine but he appears to be tolerating it well. Will cancel the amlodipine allergy and continue amlodipine for now.  Other orders -     amLODipine (NORVASC) 5 MG tablet; Take 1 tablet (5 mg total) by mouth daily.   I have discontinued Mr. Kitchens's promethazine-dextromethorphan. I am also having him start on meclizine. Additionally, I am having him maintain his b complex vitamins, aspirin EC, Zinc, multivitamin with minerals, Fish Oil, Garlic, Calcium Carbonate-Vitamin D (CALCIUM 600 + D PO), cholecalciferol, vitamin C, diphenhydrAMINE, dorzolamide, omeprazole, venlafaxine XR, finasteride, silodosin, simvastatin, ezetimibe, ALPRAZolam, atorvastatin, and amLODipine.  Meds ordered this encounter  Medications  . atorvastatin (LIPITOR) 20 MG tablet    Sig:   . meclizine (ANTIVERT) 25 MG tablet    Sig: Take 1 tablet (25 mg total) by mouth 3 (three) times daily as needed for dizziness.    Dispense:  75 tablet    Refill:  1  . amLODipine (NORVASC) 5 MG tablet    Sig: Take 1 tablet (5 mg total) by mouth daily.    Dispense:  90 tablet    Refill:  3     Follow-up: Return in about 3 weeks (around 06/17/2015).  Scarlette Calico, MD

## 2015-05-27 NOTE — Telephone Encounter (Signed)
Continue taking it

## 2015-05-27 NOTE — Progress Notes (Signed)
Pre visit review using our clinic review tool, if applicable. No additional management support is needed unless otherwise documented below in the visit note. 

## 2015-05-27 NOTE — Telephone Encounter (Signed)
Calling pt back to inform. Will try again

## 2015-05-27 NOTE — Telephone Encounter (Signed)
D/c or continuing?

## 2015-05-29 ENCOUNTER — Other Ambulatory Visit: Payer: Self-pay | Admitting: Internal Medicine

## 2015-06-02 IMAGING — CT CT CHEST W/ CM
2 of 3 series · 15 of 36 positions shown, 18 images · IV contrast (Omnipaque 300)
Comparison: PA and lateral chest 05/10/2014 and 12/26/2012.

CLINICAL DATA: Chronic productive cough.  History of pneumonia.

EXAM:
CT CHEST WITH CONTRAST
TECHNIQUE: Multidetector CT imaging of the chest was performed during
intravenous contrast administration.
CONTRAST:  80 mL OMNIPAQUE IOHEXOL 300 MG/ML  SOLN

[Series 2: chest routine with · axial · 0.86mm/px · z∈[-305,-50]mm · 12 of 61 slices shown, 15 images]
[im 5/61  mediastinal]
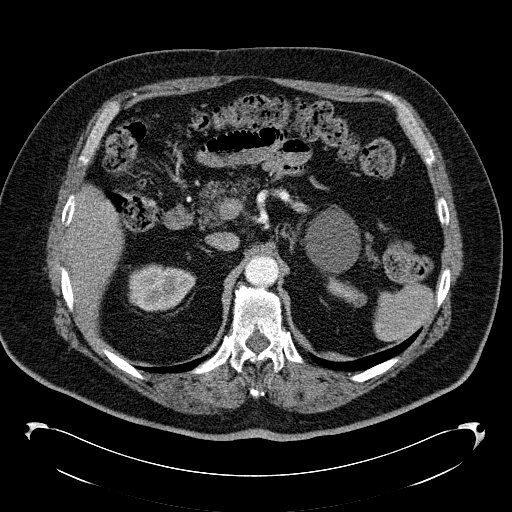
[im 5/61  lung]
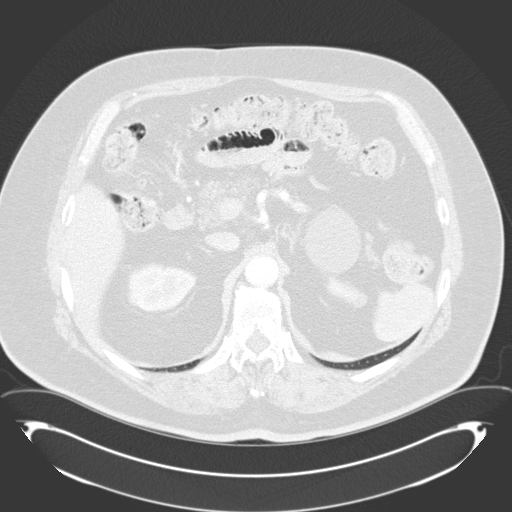
[im 9/61  lung]
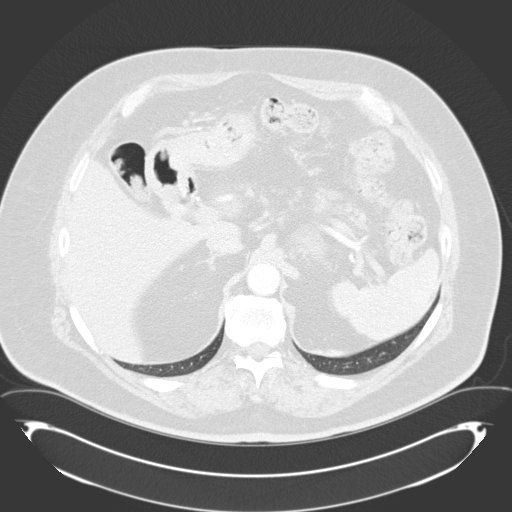
[im 14/61  lung]
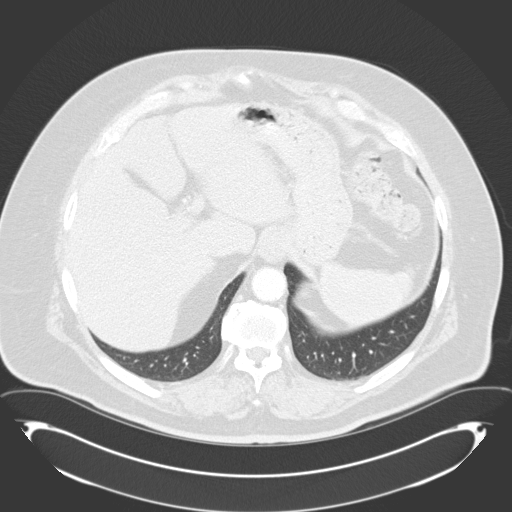
[im 18/61  lung]
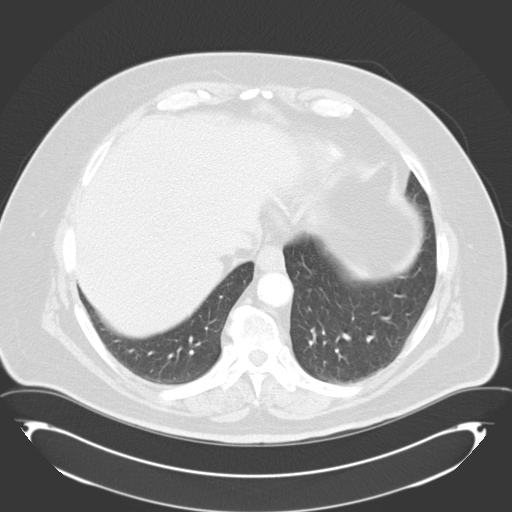
[im 23/61  mediastinal]
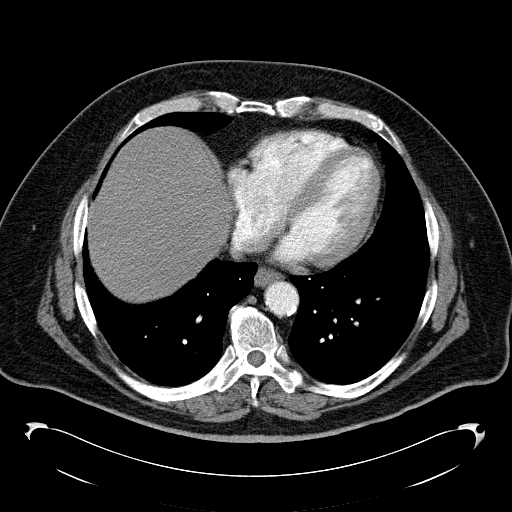
[im 23/61  lung]
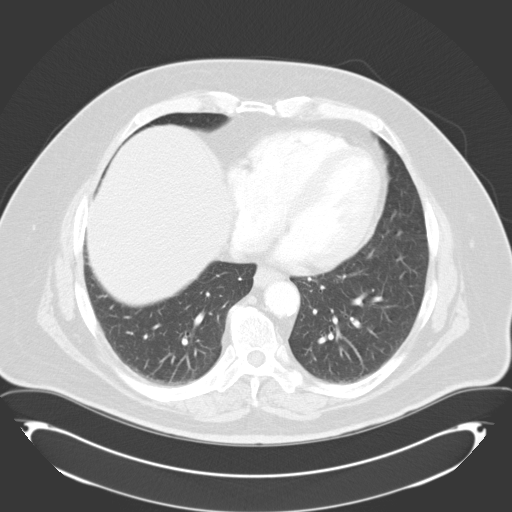
[im 27/61  lung]
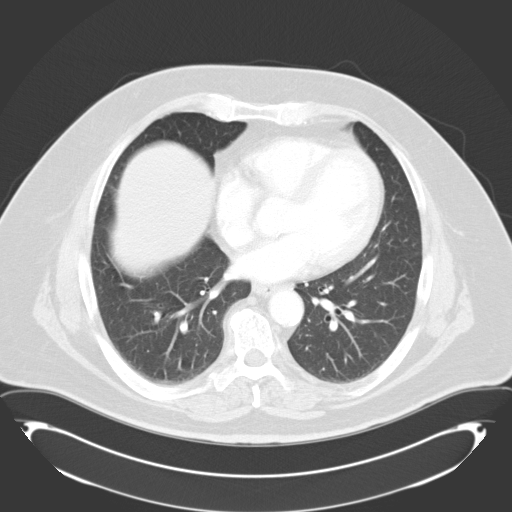
[im 34/61  lung]
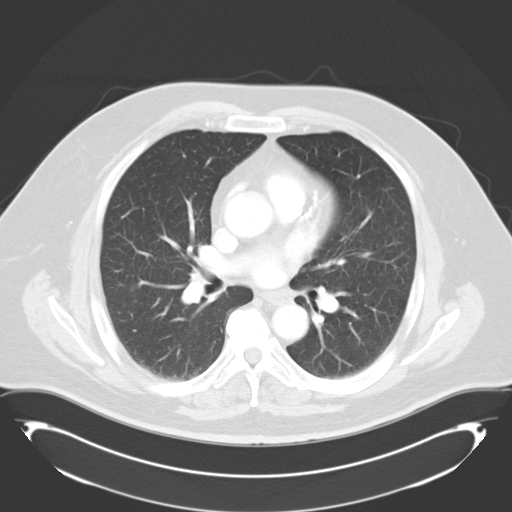
[im 38/61  lung]
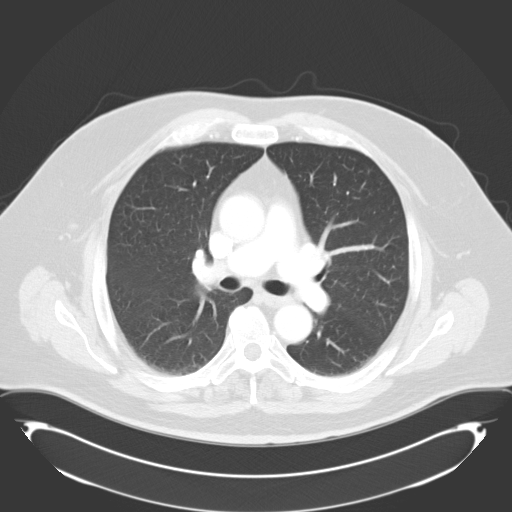
[im 43/61  mediastinal]
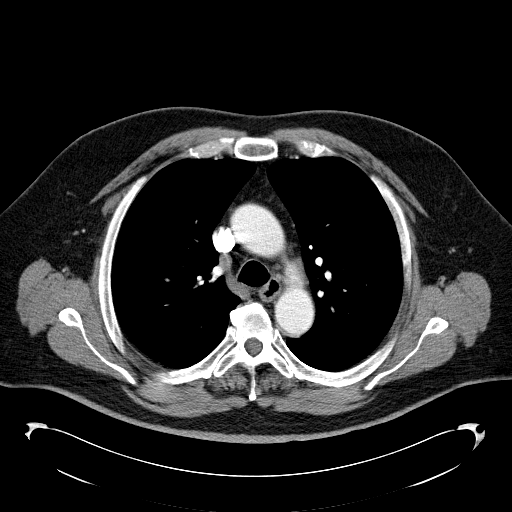
[im 43/61  lung]
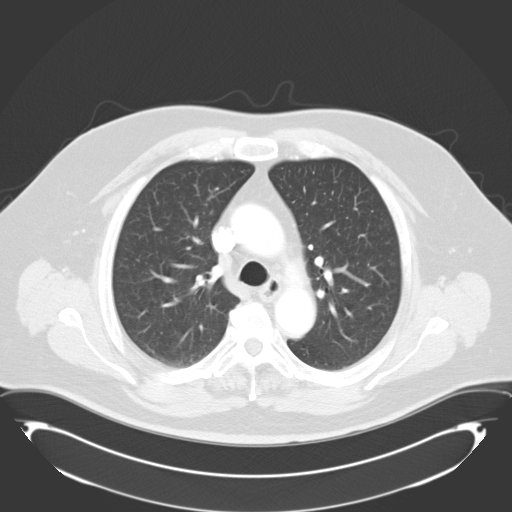
[im 47/61  lung]
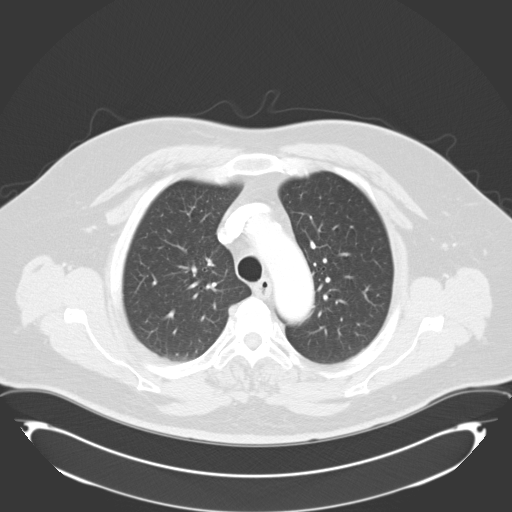
[im 52/61  lung]
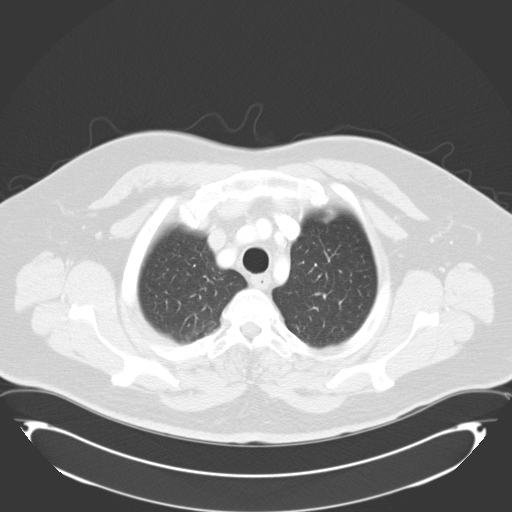
[im 56/61  lung]
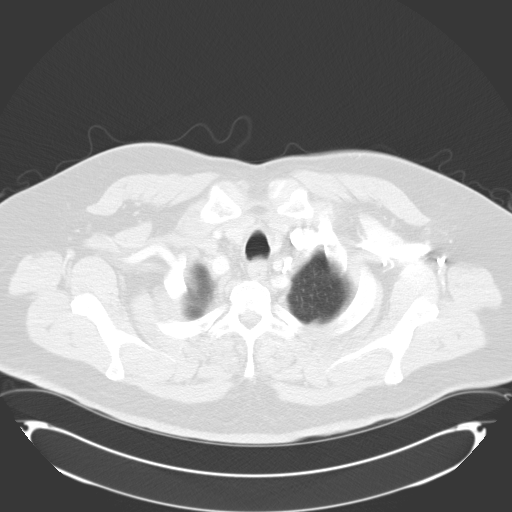

[Series 602: cor · coronal · 0.86mm/px · 3 of 132 slices shown]
[im 27/132  lung]
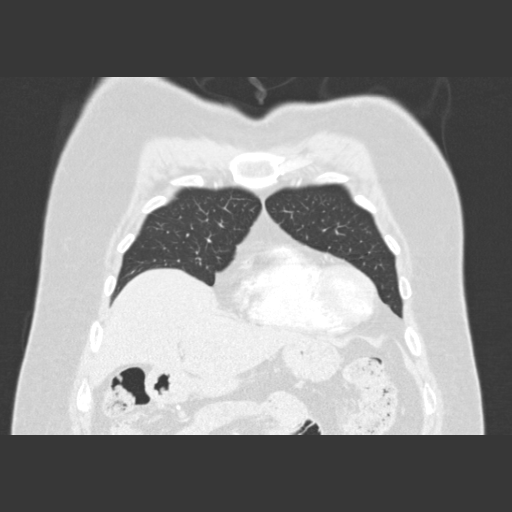
[im 53/132  lung]
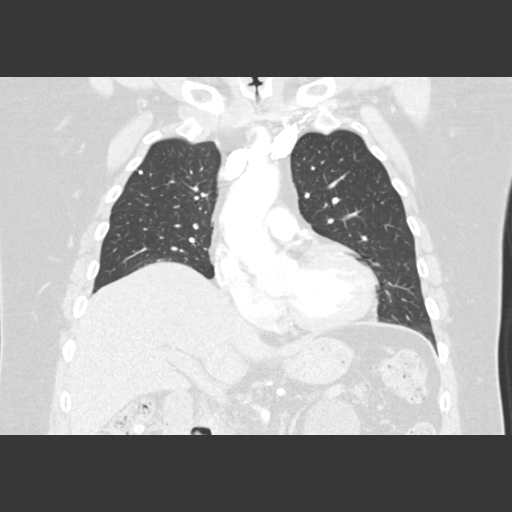
[im 79/132  lung]
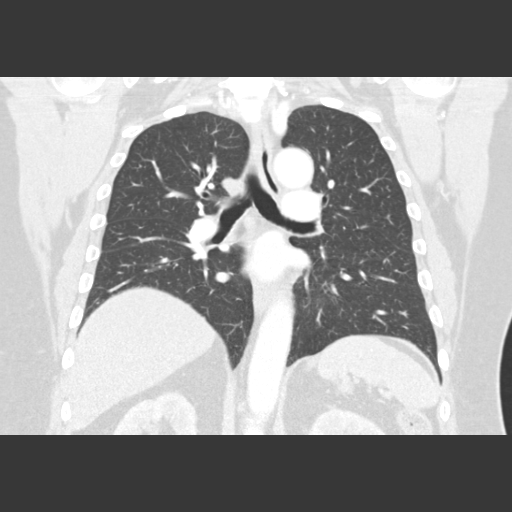

[15 of 36 positions shown; findings below may reference images not displayed]

FINDINGS: Calcific aortic and coronary atherosclerosis is noted. There is no
pleural or pericardial effusion. Heart size is normal. No axillary,
hilar or mediastinal lymphadenopathy is identified. Lungs
demonstrate punctate calcified granuloma in the right upper lobe.
Minimal dependent atelectasis is also seen. The lungs are otherwise
clear. Airways are unremarkable.

Visualized upper abdomen demonstrates partial visualization of a
cystic lesion in the left kidney. The patient is status post
cholecystectomy. No lytic or sclerotic bony lesion is identified.
IMPRESSION: No acute finding. The lungs are clear except for a punctate
calcified granuloma seen in the right upper lobe. No finding to
explain the patient's cough is identified.

Calcific aortic and coronary atherosclerosis.

## 2015-06-19 ENCOUNTER — Ambulatory Visit (INDEPENDENT_AMBULATORY_CARE_PROVIDER_SITE_OTHER): Payer: Medicare Other | Admitting: Internal Medicine

## 2015-06-19 ENCOUNTER — Encounter: Payer: Self-pay | Admitting: Internal Medicine

## 2015-06-19 VITALS — BP 128/70 | HR 64 | Temp 98.1°F | Resp 16 | Ht 71.0 in | Wt 309.0 lb

## 2015-06-19 DIAGNOSIS — I10 Essential (primary) hypertension: Secondary | ICD-10-CM | POA: Diagnosis not present

## 2015-06-19 DIAGNOSIS — H8113 Benign paroxysmal vertigo, bilateral: Secondary | ICD-10-CM | POA: Diagnosis not present

## 2015-06-19 DIAGNOSIS — Z23 Encounter for immunization: Secondary | ICD-10-CM

## 2015-06-19 NOTE — Progress Notes (Signed)
Pre visit review using our clinic review tool, if applicable. No additional management support is needed unless otherwise documented below in the visit note. 

## 2015-06-19 NOTE — Patient Instructions (Signed)

## 2015-06-22 NOTE — Assessment & Plan Note (Signed)
His blood pressure is well-controlled 

## 2015-06-22 NOTE — Progress Notes (Signed)
Subjective:  [name], male    DOB: 03-16-1941  Age: 74 y.o. MRN: [id number]  CC: Hypertension   HPI [name] presents for follow-up after recent episode of vertigo. The dizziness and vertigo has resolved. He feels well today and offers no complaints.  Outpatient Prescriptions Prior to Visit  Medication Sig Dispense Refill  . ALPRAZolam (XANAX) 0.5 MG tablet Take 1 tablet (0.5 mg total) by mouth at bedtime as needed for anxiety. 90 tablet 0  . amLODipine (NORVASC) 5 MG tablet Take 1 tablet (5 mg total) by mouth daily. 90 tablet 3  . aspirin EC 81 MG tablet Take 81 mg by mouth every evening.    Marland Kitchen atorvastatin (LIPITOR) 20 MG tablet     . b complex vitamins tablet Take 1 tablet by mouth daily.      . Calcium Carbonate-Vitamin D (CALCIUM 600 + D PO) Take 1 tablet by mouth daily.    . cholecalciferol (VITAMIN D) 1000 UNITS tablet Take 1,000 Units by mouth daily.    . diphenhydrAMINE (BENADRYL) 25 MG tablet Take 25 mg by mouth at bedtime as needed for allergies. Generic Allergy medication from [facility]    . dorzolamide (TRUSOPT) 2 % ophthalmic solution 1 drop 2 (two) times daily.    Marland Kitchen ezetimibe (ZETIA) 10 MG tablet Take 1 tablet (10 mg total) by mouth daily. 90 tablet 3  . finasteride (PROSCAR) 5 MG tablet TAKE 1 TABLET BY MOUTH DAILY 90 tablet 3  . Garlic 4818 MG CAPS Take 1,000 mg by mouth daily.    . meclizine (ANTIVERT) 25 MG tablet Take 1 tablet (25 mg total) by mouth 3 (three) times daily as needed for dizziness. 75 tablet 1  . Multiple Vitamin (MULTIVITAMIN WITH MINERALS) TABS Take 1 tablet by mouth daily.    . Omega-3 Fatty Acids (FISH OIL) 1000 MG CAPS Take 1,000 mg by mouth daily.    Marland Kitchen omeprazole (PRILOSEC) 20 MG capsule Take 20 mg by mouth daily.    . silodosin (RAPAFLO) 8 MG CAPS capsule Take 1 capsule (8 mg total) by mouth daily with breakfast. 30 capsule 11  . simvastatin (ZOCOR) 40 MG tablet TAKE 1 TABLET BY MOUTH EVERY NIGHT AT BEDTIME 90 tablet 3    . venlafaxine XR (EFFEXOR-XR) 150 MG 24 hr capsule TAKE ONE CAPSULE BY MOUTH EVERY DAY 90 capsule 3  . vitamin C (ASCORBIC ACID) 500 MG tablet Take 500 mg by mouth daily.    . Zinc 50 MG TABS Take 50 mg by mouth daily.     No facility-administered medications prior to visit.    ROS Review of Systems  Constitutional: Negative.  Negative for fever, chills, diaphoresis, appetite change and fatigue.  HENT: Negative.   Eyes: Negative.  Negative for photophobia and visual disturbance.  Respiratory: Negative.   Cardiovascular: Negative.  Negative for chest pain, palpitations and leg swelling.  Gastrointestinal: Negative.  Negative for nausea, vomiting, abdominal pain, diarrhea and constipation.  Endocrine: Negative.   Genitourinary: Negative.   Musculoskeletal: Negative.   Skin: Negative.  Negative for rash.  Allergic/Immunologic: Negative.   Neurological: Negative.  Negative for dizziness, syncope, facial asymmetry, speech difficulty, weakness, light-headedness, numbness and headaches.  Hematological: Negative.  Negative for adenopathy. Does not bruise/bleed easily.  Psychiatric/Behavioral: Negative.     Objective:  BP 128/70 mmHg  Pulse 64  Temp(Src) 98.1 F (36.7 C) (Oral)  Ht 5\' 11"  (1.803 m)  Wt 309 lb (140.161 kg)  BMI 43.12 kg/m2  SpO2 96%  BP Readings from Last 3 Encounters:  06/19/15 128/70  05/27/15 148/70  08/21/14 140/90    Wt Readings from Last 3 Encounters:  06/19/15 309 lb (140.161 kg)  05/27/15 305 lb (138.347 kg)  08/21/14 293 lb (132.904 kg)    Physical Exam  Constitutional: He is oriented to person, place, and time. No distress.  HENT:  Mouth/Throat: Oropharynx is clear and moist. No oropharyngeal exudate.  Eyes: Conjunctivae are normal. Right eye exhibits no discharge. Left eye exhibits no discharge. No scleral icterus.  Neck: Normal range of motion. Neck supple. No JVD present. No tracheal deviation present. No thyromegaly present.  Cardiovascular:  Normal rate, regular rhythm, normal heart sounds and intact distal pulses.  Exam reveals no gallop and no friction rub.   No murmur heard. Pulmonary/Chest: Effort normal and breath sounds normal. No stridor. No respiratory distress. He has no wheezes. He has no rales. He exhibits no tenderness.  Abdominal: Soft. Bowel sounds are normal. He exhibits no distension and no mass. There is no tenderness. There is no rebound and no guarding.  Musculoskeletal: Normal range of motion. He exhibits no edema or tenderness.  Lymphadenopathy:    He has no cervical adenopathy.  Neurological: He is oriented to person, place, and time.  Skin: Skin is warm and dry. No rash noted. He is not diaphoretic. No erythema. No pallor.    Lab Results  Component Value Date   WBC 7.3 08/21/2014   HGB 15.6 08/21/2014   HCT 47.1 08/21/2014   PLT 176.0 08/21/2014   GLUCOSE 111* 08/21/2014   CHOL 148 08/21/2014   TRIG 164.0* 08/21/2014   HDL 49.20 08/21/2014   LDLDIRECT 96.4 09/19/2007   LDLCALC 66 08/21/2014   ALT 19 08/21/2014   AST 30 08/21/2014   NA 141 08/21/2014   K 4.2 08/21/2014   CL 108 08/21/2014   CREATININE 1.0 08/21/2014   BUN 11 08/21/2014   CO2 22 08/21/2014   TSH 1.87 08/21/2014   PSA 0.38 08/21/2014    Ct Chest W Contrast  05/21/2014   CLINICAL DATA:  Chronic productive cough.  History of pneumonia.  EXAM: CT CHEST WITH CONTRAST  TECHNIQUE: Multidetector CT imaging of the chest was performed during intravenous contrast administration.  CONTRAST:  80 mL OMNIPAQUE IOHEXOL 300 MG/ML  SOLN  COMPARISON:  PA and lateral chest 05/10/2014 and 12/26/2012.  FINDINGS: Calcific aortic and coronary atherosclerosis is noted. There is no pleural or pericardial effusion. Heart size is normal. No axillary, hilar or mediastinal lymphadenopathy is identified. Lungs demonstrate punctate calcified granuloma in the right upper lobe. Minimal dependent atelectasis is also seen. The lungs are otherwise clear. Airways are  unremarkable.  Visualized upper abdomen demonstrates partial visualization of a cystic lesion in the left kidney. The patient is status post cholecystectomy. No lytic or sclerotic bony lesion is identified.  IMPRESSION: No acute finding. The lungs are clear except for a punctate calcified granuloma seen in the right upper lobe. No finding to explain the patient's cough is identified.  Calcific aortic and coronary atherosclerosis.   Electronically Signed   By: Inge Rise M.D.   On: 05/21/2014 10:25    Assessment & Plan:   Bomani was seen today for hypertension.  Diagnoses and all orders for this visit:  Need for influenza vaccination -     Flu vaccine HIGH DOSE PF (Fluzone High dose)   I am having Mr. Purdum maintain his b complex vitamins, aspirin EC, Zinc, multivitamin with minerals,  Fish Oil, Garlic, Calcium Carbonate-Vitamin D (CALCIUM 600 + D PO), cholecalciferol, vitamin C, diphenhydrAMINE, dorzolamide, omeprazole, venlafaxine XR, finasteride, silodosin, ezetimibe, ALPRAZolam, atorvastatin, meclizine, amLODipine, and simvastatin.  No orders of the defined types were placed in this encounter.     Follow-up: Return in about 3 months (around 09/18/2015).  Scarlette Calico, MD

## 2015-06-22 NOTE — Assessment & Plan Note (Signed)
He was treated with Antivert and the symptoms have resolved.

## 2015-07-14 ENCOUNTER — Telehealth: Payer: Self-pay | Admitting: *Deleted

## 2015-07-14 MED ORDER — EZETIMIBE 10 MG PO TABS: 10.0000 mg | ORAL_TABLET | Freq: Every day | ORAL | Status: DC

## 2015-07-14 NOTE — Telephone Encounter (Signed)
Zetia should come in generic. Have resent rx and he can ask pharmacy if they do generic. If no generic available can wait for PCP to return.

## 2015-07-14 NOTE — Telephone Encounter (Signed)
Notified pt wife with md response.../lmb 

## 2015-07-14 NOTE — Telephone Encounter (Signed)
Wife left msg on triage stating md rx Zetia, and medication cost over $300. Wanting to know does med come in generic. Or can md call rx alternative. zetia is too expensive. MD out of office pls advise...Johny Chess

## 2015-07-22 ENCOUNTER — Other Ambulatory Visit: Payer: Self-pay | Admitting: Internal Medicine

## 2015-08-01 ENCOUNTER — Other Ambulatory Visit: Payer: Self-pay

## 2015-08-01 ENCOUNTER — Telehealth: Payer: Self-pay | Admitting: Internal Medicine

## 2015-08-01 DIAGNOSIS — H8113 Benign paroxysmal vertigo, bilateral: Secondary | ICD-10-CM

## 2015-08-01 MED ORDER — MECLIZINE HCL 25 MG PO TABS: 25.0000 mg | ORAL_TABLET | Freq: Three times a day (TID) | ORAL | Status: AC | PRN

## 2015-08-01 NOTE — Telephone Encounter (Signed)
Patient states he needs med for vertigo sent to [facility] at [facility] and [address].

## 2015-08-01 NOTE — Telephone Encounter (Signed)
erx done

## 2015-08-10 ENCOUNTER — Other Ambulatory Visit: Payer: Self-pay | Admitting: Internal Medicine

## 2015-08-12 ENCOUNTER — Other Ambulatory Visit: Payer: Self-pay | Admitting: Internal Medicine

## 2015-08-13 NOTE — Telephone Encounter (Signed)
Ok to fill 

## 2015-08-19 ENCOUNTER — Other Ambulatory Visit: Payer: Self-pay | Admitting: Internal Medicine

## 2015-08-19 NOTE — Telephone Encounter (Signed)
Spoke to patient's wife. Advised that we approved ALPRAZolam (XANAX) 0.5 MG tablet on 08/13/2015. She states that pharmacy advised they dont have it. Verified pharmacy is correct.

## 2015-08-21 NOTE — Telephone Encounter (Signed)
This has been called in already.

## 2015-08-26 ENCOUNTER — Encounter: Payer: Self-pay | Admitting: Internal Medicine

## 2015-08-26 ENCOUNTER — Other Ambulatory Visit (INDEPENDENT_AMBULATORY_CARE_PROVIDER_SITE_OTHER): Payer: Medicare Other

## 2015-08-26 ENCOUNTER — Ambulatory Visit (INDEPENDENT_AMBULATORY_CARE_PROVIDER_SITE_OTHER): Payer: Medicare Other | Admitting: Internal Medicine

## 2015-08-26 VITALS — BP 138/82 | HR 67 | Temp 97.9°F | Resp 16 | Ht 71.0 in | Wt 302.0 lb

## 2015-08-26 DIAGNOSIS — I2510 Atherosclerotic heart disease of native coronary artery without angina pectoris: Secondary | ICD-10-CM

## 2015-08-26 DIAGNOSIS — R351 Nocturia: Secondary | ICD-10-CM

## 2015-08-26 DIAGNOSIS — N401 Enlarged prostate with lower urinary tract symptoms: Secondary | ICD-10-CM | POA: Diagnosis not present

## 2015-08-26 DIAGNOSIS — L989 Disorder of the skin and subcutaneous tissue, unspecified: Secondary | ICD-10-CM

## 2015-08-26 DIAGNOSIS — I10 Essential (primary) hypertension: Secondary | ICD-10-CM

## 2015-08-26 DIAGNOSIS — E785 Hyperlipidemia, unspecified: Secondary | ICD-10-CM

## 2015-08-26 DIAGNOSIS — R739 Hyperglycemia, unspecified: Secondary | ICD-10-CM

## 2015-08-26 DIAGNOSIS — R7303 Prediabetes: Secondary | ICD-10-CM | POA: Insufficient documentation

## 2015-08-26 DIAGNOSIS — Z0000 Encounter for general adult medical examination without abnormal findings: Secondary | ICD-10-CM | POA: Diagnosis not present

## 2015-08-26 LAB — CBC WITH DIFFERENTIAL/PLATELET
BASOS ABS: 0.1 10*3/uL (ref 0.0–0.1)
Basophils Relative: 0.9 % (ref 0.0–3.0)
EOS PCT: 2.8 % (ref 0.0–5.0)
Eosinophils Absolute: 0.2 10*3/uL (ref 0.0–0.7)
HEMATOCRIT: 49.2 % (ref 39.0–52.0)
HEMOGLOBIN: 16.3 g/dL (ref 13.0–17.0)
Lymphocytes Relative: 38.7 % (ref 12.0–46.0)
Lymphs Abs: 2.6 10*3/uL (ref 0.7–4.0)
MCHC: 33.2 g/dL (ref 30.0–36.0)
MCV: 92.7 fl (ref 78.0–100.0)
MONOS PCT: 9.5 % (ref 3.0–12.0)
Monocytes Absolute: 0.6 10*3/uL (ref 0.1–1.0)
NEUTROS PCT: 48.1 % (ref 43.0–77.0)
Neutro Abs: 3.2 10*3/uL (ref 1.4–7.7)
Platelets: 200 10*3/uL (ref 150.0–400.0)
RBC: 5.3 Mil/uL (ref 4.22–5.81)
RDW: 14 % (ref 11.5–15.5)
WBC: 6.6 10*3/uL (ref 4.0–10.5)

## 2015-08-26 LAB — COMPREHENSIVE METABOLIC PANEL
ALK PHOS: 74 U/L (ref 39–117)
ALT: 20 U/L (ref 0–53)
AST: 25 U/L (ref 0–37)
Albumin: 4.5 g/dL (ref 3.5–5.2)
BILIRUBIN TOTAL: 0.5 mg/dL (ref 0.2–1.2)
BUN: 16 mg/dL (ref 6–23)
CALCIUM: 10.4 mg/dL (ref 8.4–10.5)
CO2: 29 mEq/L (ref 19–32)
Chloride: 105 mEq/L (ref 96–112)
Creatinine, Ser: 0.97 mg/dL (ref 0.40–1.50)
GFR: 80.86 mL/min (ref >60.00)
GLUCOSE: 113 mg/dL — AB (ref 70–99)
POTASSIUM: 4.7 meq/L (ref 3.5–5.1)
Sodium: 143 mEq/L (ref 135–145)
TOTAL PROTEIN: 7.2 g/dL (ref 6.0–8.3)

## 2015-08-26 LAB — PSA: PSA: 0.32 ng/mL (ref 0.10–4.00)

## 2015-08-26 LAB — LIPID PANEL
Cholesterol: 172 mg/dL (ref 0–200)
HDL: 55.1 mg/dL (ref >39.00)
LDL Cholesterol: 88 mg/dL (ref 0–99)
NONHDL: 116.61
TRIGLYCERIDES: 141 mg/dL (ref 0.0–149.0)
Total CHOL/HDL Ratio: 3
VLDL: 28.2 mg/dL (ref 0.0–40.0)

## 2015-08-26 LAB — FECAL OCCULT BLOOD, GUAIAC: FECAL OCCULT BLD: NEGATIVE

## 2015-08-26 LAB — HEMOGLOBIN A1C: Hgb A1c MFr Bld: 6.3 % (ref 4.6–6.5)

## 2015-08-26 LAB — TSH: TSH: 1.93 u[IU]/mL (ref 0.35–4.50)

## 2015-08-26 MED ORDER — ATORVASTATIN CALCIUM 20 MG PO TABS: 20.0000 mg | ORAL_TABLET | Freq: Every day | ORAL | Status: DC

## 2015-08-26 NOTE — Patient Instructions (Addendum)

## 2015-08-26 NOTE — Progress Notes (Signed)
Pre visit review using our clinic review tool, if applicable. No additional management support is needed unless otherwise documented below in the visit note. 

## 2015-08-26 NOTE — Progress Notes (Signed)
Subjective:  [name], male    DOB: 1941-03-22  Age: 74 y.o. MRN: [id number]  CC: Hyperlipidemia and Annual Exam   HPI DOYEL TALBOT presents for a CPX, he complains about lesions on his scalp that have been bothering him for several months. He has lost 7 lbs with diet and exercise.   Outpatient Prescriptions Prior to Visit  Medication Sig Dispense Refill  . ALPRAZolam (XANAX) 0.5 MG tablet TAKE 1 TABLET BY MOUTH AT BEDTIME AS NEEDED FOR ANXIETY 90 tablet 2  . amLODipine (NORVASC) 5 MG tablet Take 1 tablet (5 mg total) by mouth daily. 90 tablet 3  . amLODipine (NORVASC) 5 MG tablet TAKE 1 TABLET(5 MG) BY MOUTH DAILY 90 tablet 2  . aspirin EC 81 MG tablet Take 81 mg by mouth every evening.    Marland Kitchen b complex vitamins tablet Take 1 tablet by mouth daily.      . Calcium Carbonate-Vitamin D (CALCIUM 600 + D PO) Take 1 tablet by mouth daily.    . cholecalciferol (VITAMIN D) 1000 UNITS tablet Take 1,000 Units by mouth daily.    . diphenhydrAMINE (BENADRYL) 25 MG tablet Take 25 mg by mouth at bedtime as needed for allergies. Generic Allergy medication from [facility]    . dorzolamide (TRUSOPT) 2 % ophthalmic solution 1 drop 2 (two) times daily.    . finasteride (PROSCAR) 5 MG tablet TAKE 1 TABLET BY MOUTH DAILY 90 tablet 3  . Garlic 123XX123 MG CAPS Take 1,000 mg by mouth daily.    . meclizine (ANTIVERT) 25 MG tablet Take 1 tablet (25 mg total) by mouth 3 (three) times daily as needed for dizziness. 75 tablet 1  . Multiple Vitamin (MULTIVITAMIN WITH MINERALS) TABS Take 1 tablet by mouth daily.    . Omega-3 Fatty Acids (FISH OIL) 1000 MG CAPS Take 1,000 mg by mouth daily.    Marland Kitchen omeprazole (PRILOSEC) 20 MG capsule Take 20 mg by mouth daily.    . silodosin (RAPAFLO) 8 MG CAPS capsule Take 1 capsule (8 mg total) by mouth daily with breakfast. 30 capsule 11  . venlafaxine XR (EFFEXOR-XR) 150 MG 24 hr capsule TAKE ONE CAPSULE BY MOUTH EVERY DAY 90 capsule 3  . vitamin C (ASCORBIC ACID) 500  MG tablet Take 500 mg by mouth daily.    . Zinc 50 MG TABS Take 50 mg by mouth daily.    Marland Kitchen atorvastatin (LIPITOR) 20 MG tablet     . ezetimibe (ZETIA) 10 MG tablet Take 1 tablet (10 mg total) by mouth daily. 90 tablet 3  . simvastatin (ZOCOR) 40 MG tablet TAKE 1 TABLET BY MOUTH EVERY NIGHT AT BEDTIME 90 tablet 3   No facility-administered medications prior to visit.    ROS Review of Systems  Constitutional: Negative.  Negative for fever, chills, diaphoresis, appetite change and fatigue.  HENT: Negative.   Eyes: Negative.   Respiratory: Negative.  Negative for cough, choking, chest tightness, shortness of breath and stridor.   Cardiovascular: Negative.  Negative for chest pain, palpitations and leg swelling.  Gastrointestinal: Negative.  Negative for nausea, vomiting, abdominal pain, diarrhea, constipation and blood in stool.  Endocrine: Negative.  Negative for polydipsia, polyphagia and polyuria.  Genitourinary: Negative.  Negative for dysuria, urgency, frequency, hematuria, decreased urine volume and difficulty urinating.  Musculoskeletal: Negative.  Negative for myalgias, back pain, joint swelling and arthralgias.  Skin: Negative.   Allergic/Immunologic: Negative.   Neurological: Negative.  Negative for dizziness, tremors, weakness and  light-headedness.  Hematological: Negative.  Negative for adenopathy. Does not bruise/bleed easily.  Psychiatric/Behavioral: Negative.     Objective:  BP 138/82 mmHg  Pulse 67  Temp(Src) 97.9 F (36.6 C) (Oral)  Ht 5\' 11"  (1.803 m)  Wt 302 lb (136.986 kg)  BMI 42.14 kg/m2  SpO2 96%  BP Readings from Last 3 Encounters:  08/26/15 138/82  06/19/15 128/70  05/27/15 148/70    Wt Readings from Last 3 Encounters:  08/26/15 302 lb (136.986 kg)  06/19/15 309 lb (140.161 kg)  05/27/15 305 lb (138.347 kg)    Physical Exam  Constitutional: He is oriented to person, place, and time. He appears well-developed and well-nourished. No distress.    HENT:  Head: Normocephalic and atraumatic.  Mouth/Throat: Oropharynx is clear and moist. No oropharyngeal exudate.  Eyes: Conjunctivae are normal. Right eye exhibits no discharge. Left eye exhibits no discharge. No scleral icterus.  Neck: Normal range of motion. Neck supple. No JVD present. No tracheal deviation present. No thyromegaly present.  Cardiovascular: Normal rate, regular rhythm, normal heart sounds and intact distal pulses.  Exam reveals no gallop and no friction rub.   No murmur heard. Pulmonary/Chest: Effort normal and breath sounds normal. No stridor. No respiratory distress. He has no wheezes. He has no rales. He exhibits no tenderness.  Abdominal: Soft. Bowel sounds are normal. He exhibits no distension and no mass. There is no tenderness. There is no rebound and no guarding. Hernia confirmed negative in the right inguinal area and confirmed negative in the left inguinal area.  Genitourinary: Prostate normal, testes normal and penis normal. Rectal exam shows external hemorrhoid. Rectal exam shows no internal hemorrhoid, no fissure, no mass, no tenderness and anal tone normal. Guaiac negative stool. Prostate is not enlarged and not tender. Right testis shows no mass, no swelling and no tenderness. Right testis is descended. Left testis shows no mass, no swelling and no tenderness. Left testis is descended. Uncircumcised. No phimosis, paraphimosis, hypospadias, penile erythema or penile tenderness. No discharge found.  Musculoskeletal: Normal range of motion. He exhibits no edema or tenderness.  Lymphadenopathy:    He has no cervical adenopathy.       Right: No inguinal adenopathy present.       Left: No inguinal adenopathy present.  Neurological: He is oriented to person, place, and time.  Skin: Skin is warm, dry and intact. Rash noted. No abrasion, no bruising, no burn, no ecchymosis, no laceration, no lesion, no petechiae and no purpura noted. Rash is macular. Rash is not papular,  not nodular, not pustular, not vesicular and not urticarial. He is not diaphoretic. No erythema. No pallor.  Crown of scalp reveals a 1 cm raised, fleshy, scaly lesion with pearly edges  Psychiatric: He has a normal mood and affect. His behavior is normal. Judgment and thought content normal.  Vitals reviewed.   Lab Results  Component Value Date   WBC 6.6 08/26/2015   HGB 16.3 08/26/2015   HCT 49.2 08/26/2015   PLT 200.0 08/26/2015   GLUCOSE 113* 08/26/2015   CHOL 172 08/26/2015   TRIG 141.0 08/26/2015   HDL 55.10 08/26/2015   LDLDIRECT 96.4 09/19/2007   LDLCALC 88 08/26/2015   ALT 20 08/26/2015   AST 25 08/26/2015   NA 143 08/26/2015   K 4.7 08/26/2015   CL 105 08/26/2015   CREATININE 0.97 08/26/2015   BUN 16 08/26/2015   CO2 29 08/26/2015   TSH 1.93 08/26/2015   PSA 0.32 08/26/2015   HGBA1C  6.3 08/26/2015    Ct Chest W Contrast  05/21/2014  CLINICAL DATA:  Chronic productive cough.  History of pneumonia. EXAM: CT CHEST WITH CONTRAST TECHNIQUE: Multidetector CT imaging of the chest was performed during intravenous contrast administration. CONTRAST:  80 mL OMNIPAQUE IOHEXOL 300 MG/ML  SOLN COMPARISON:  PA and lateral chest 05/10/2014 and 12/26/2012. FINDINGS: Calcific aortic and coronary atherosclerosis is noted. There is no pleural or pericardial effusion. Heart size is normal. No axillary, hilar or mediastinal lymphadenopathy is identified. Lungs demonstrate punctate calcified granuloma in the right upper lobe. Minimal dependent atelectasis is also seen. The lungs are otherwise clear. Airways are unremarkable. Visualized upper abdomen demonstrates partial visualization of a cystic lesion in the left kidney. The patient is status post cholecystectomy. No lytic or sclerotic bony lesion is identified. IMPRESSION: No acute finding. The lungs are clear except for a punctate calcified granuloma seen in the right upper lobe. No finding to explain the patient's cough is identified. Calcific  aortic and coronary atherosclerosis. Electronically Signed   By: Inge Rise M.D.   On: 05/21/2014 10:25    Assessment & Plan:   Laquan was seen today for hyperlipidemia and annual exam.  Diagnoses and all orders for this visit:  BPH associated with nocturia- his PSA is normal, there are no symptoms that need to be treated -     PSA; Future  Atherosclerosis of native coronary artery of native heart without angina pectoris- he has not had any recent episodes of angina, will cont risk factor modifications -     Lipid panel; Future -     atorvastatin (LIPITOR) 20 MG tablet; Take 1 tablet (20 mg total) by mouth daily at 6 PM.  Essential hypertension- his BP is well controlled, lytes and renal function are stable -     CBC with Differential/Platelet; Future  Hyperlipidemia with target LDL less than 100- he has achieved his LDL goal, he is not willing to take zetia -     Lipid panel; Future -     TSH; Future -     atorvastatin (LIPITOR) 20 MG tablet; Take 1 tablet (20 mg total) by mouth daily at 6 PM.  Hyperglycemia- he has prediabetes and will cont to work on his lifestyle modifications -     Comprehensive metabolic panel; Future -     Hemoglobin A1c; Future  Skin lesion of scalp -     Ambulatory referral to Dermatology  I have discontinued Mr. Goettel's simvastatin and ezetimibe. I have also changed his atorvastatin. Additionally, I am having him maintain his b complex vitamins, aspirin EC, Zinc, multivitamin with minerals, Fish Oil, Garlic, Calcium Carbonate-Vitamin D (CALCIUM 600 + D PO), cholecalciferol, vitamin C, diphenhydrAMINE, dorzolamide, omeprazole, silodosin, amLODipine, venlafaxine XR, meclizine, finasteride, ALPRAZolam, amLODipine, and dorzolamide-timolol.  Meds ordered this encounter  Medications  . dorzolamide-timolol (COSOPT) 22.3-6.8 MG/ML ophthalmic solution    Sig: INSTILL 1 DROP IN OU Q 12 H    Refill:  3  . atorvastatin (LIPITOR) 20 MG tablet    Sig: Take 1  tablet (20 mg total) by mouth daily at 6 PM.    Dispense:  90 tablet    Refill:  3   See AVS for instructions about healthy living and anticipatory guidance.  Follow-up: Return in about 6 months (around 02/23/2016).  Scarlette Calico, MD

## 2015-08-26 NOTE — Assessment & Plan Note (Signed)

## 2015-10-02 ENCOUNTER — Encounter: Payer: Self-pay | Admitting: Gastroenterology

## 2016-02-23 ENCOUNTER — Ambulatory Visit: Payer: Medicare Other | Admitting: Internal Medicine

## 2016-05-02 ENCOUNTER — Other Ambulatory Visit: Payer: Self-pay | Admitting: Internal Medicine

## 2016-05-16 ENCOUNTER — Other Ambulatory Visit: Payer: Self-pay | Admitting: Internal Medicine

## 2016-07-16 ENCOUNTER — Other Ambulatory Visit: Payer: Self-pay | Admitting: Internal Medicine

## 2016-07-29 ENCOUNTER — Other Ambulatory Visit: Payer: Self-pay | Admitting: Internal Medicine

## 2016-08-13 ENCOUNTER — Other Ambulatory Visit: Payer: Self-pay | Admitting: Internal Medicine

## 2016-08-13 DIAGNOSIS — E785 Hyperlipidemia, unspecified: Secondary | ICD-10-CM

## 2016-08-13 DIAGNOSIS — I2510 Atherosclerotic heart disease of native coronary artery without angina pectoris: Secondary | ICD-10-CM

## 2016-08-13 MED ORDER — ATORVASTATIN CALCIUM 20 MG PO TABS: 20.0000 mg | ORAL_TABLET | Freq: Every day | ORAL | 3 refills | Status: DC

## 2016-08-13 NOTE — Telephone Encounter (Signed)
rq is for 2 cholesterol medications. Pt has been seen in a year and there are no appointments scheduled.   rq for both has been denied.   Please advise - if pt does sched. an appt which one (or both) should be prescribed.

## 2016-08-27 DIAGNOSIS — C44229 Squamous cell carcinoma of skin of left ear and external auricular canal: Secondary | ICD-10-CM | POA: Insufficient documentation

## 2016-08-27 DIAGNOSIS — R4189 Other symptoms and signs involving cognitive functions and awareness: Secondary | ICD-10-CM | POA: Insufficient documentation

## 2016-10-13 ENCOUNTER — Other Ambulatory Visit: Payer: Self-pay | Admitting: Internal Medicine

## 2016-10-25 DIAGNOSIS — G4733 Obstructive sleep apnea (adult) (pediatric): Secondary | ICD-10-CM | POA: Insufficient documentation

## 2016-10-26 ENCOUNTER — Other Ambulatory Visit: Payer: Self-pay | Admitting: Internal Medicine

## 2017-01-24 ENCOUNTER — Other Ambulatory Visit: Payer: Self-pay | Admitting: Internal Medicine

## 2017-07-24 ENCOUNTER — Other Ambulatory Visit: Payer: Self-pay | Admitting: Internal Medicine

## 2017-07-29 ENCOUNTER — Other Ambulatory Visit: Payer: Self-pay | Admitting: Internal Medicine

## 2017-08-12 ENCOUNTER — Other Ambulatory Visit: Payer: Self-pay | Admitting: Internal Medicine

## 2017-08-12 DIAGNOSIS — E785 Hyperlipidemia, unspecified: Secondary | ICD-10-CM

## 2017-08-12 DIAGNOSIS — I2510 Atherosclerotic heart disease of native coronary artery without angina pectoris: Secondary | ICD-10-CM

## 2018-03-31 DIAGNOSIS — I7121 Aneurysm of the ascending aorta, without rupture: Secondary | ICD-10-CM | POA: Insufficient documentation

## 2018-03-31 DIAGNOSIS — I712 Thoracic aortic aneurysm, without rupture: Secondary | ICD-10-CM | POA: Insufficient documentation

## 2018-06-11 ENCOUNTER — Other Ambulatory Visit: Payer: Self-pay | Admitting: Internal Medicine

## 2018-06-28 DIAGNOSIS — C44219 Basal cell carcinoma of skin of left ear and external auricular canal: Secondary | ICD-10-CM | POA: Insufficient documentation

## 2018-08-02 DIAGNOSIS — Z85828 Personal history of other malignant neoplasm of skin: Secondary | ICD-10-CM | POA: Insufficient documentation

## 2019-03-22 DIAGNOSIS — G5602 Carpal tunnel syndrome, left upper limb: Secondary | ICD-10-CM | POA: Insufficient documentation

## 2019-03-22 DIAGNOSIS — N281 Cyst of kidney, acquired: Secondary | ICD-10-CM | POA: Insufficient documentation

## 2019-12-02 ENCOUNTER — Other Ambulatory Visit: Payer: Self-pay

## 2019-12-02 ENCOUNTER — Ambulatory Visit: Payer: Medicare Other | Attending: Internal Medicine

## 2019-12-02 ENCOUNTER — Ambulatory Visit: Payer: Medicare Other

## 2019-12-02 DIAGNOSIS — Z23 Encounter for immunization: Secondary | ICD-10-CM | POA: Insufficient documentation

## 2019-12-02 NOTE — Progress Notes (Signed)
   Covid-19 Vaccination Clinic  Name:  [name]    MRN: [id number] DOB: Nov 24, 1940  12/02/2019  Mr. [name] was observed post Covid-19 immunization for 15 minutes without incidence. He was provided with Vaccine Information Sheet and instruction to access the V-Safe system.   Mr. [name] was instructed to call 911 with any severe reactions post vaccine: Marland Kitchen Difficulty breathing  . Swelling of your face and throat  . A fast heartbeat  . A bad rash all over your body  . Dizziness and weakness    Immunizations Administered    Name Date Dose VIS Date Route   Pfizer COVID-19 Vaccine 12/02/2019  2:22 PM 0.3 mL 09/21/2019 Intramuscular   Manufacturer: [place]   Lot: [id number]   [place]: [id number]

## 2019-12-25 ENCOUNTER — Ambulatory Visit: Payer: Medicare Other | Attending: Internal Medicine

## 2019-12-25 DIAGNOSIS — Z23 Encounter for immunization: Secondary | ICD-10-CM

## 2019-12-25 NOTE — Progress Notes (Signed)
   Covid-19 Vaccination Clinic  Name:  [name]    MRN: [id number] DOB: [birth year]  12/25/2019  Mr. [name] was observed post Covid-19 immunization for 15 minutes without incident. He was provided with Vaccine Information Sheet and instruction to access the V-Safe system.   Mr. [name] was instructed to call 911 with any severe reactions post vaccine: Marland Kitchen Difficulty breathing  . Swelling of face and throat  . A fast heartbeat  . A bad rash all over body  . Dizziness and weakness   Immunizations Administered    Name Date Dose VIS Date Route   Pfizer COVID-19 Vaccine 12/25/2019  2:14 PM 0.3 mL 09/21/2019 Intramuscular   Manufacturer: [place]   Lot: [id number]   [place]: [id number]

## 2020-09-09 DIAGNOSIS — N4889 Other specified disorders of penis: Secondary | ICD-10-CM | POA: Insufficient documentation

## 2020-09-10 ENCOUNTER — Ambulatory Visit (INDEPENDENT_AMBULATORY_CARE_PROVIDER_SITE_OTHER): Payer: Medicare Other | Admitting: Otolaryngology

## 2020-09-18 DIAGNOSIS — A528 Late syphilis, latent: Secondary | ICD-10-CM | POA: Insufficient documentation

## 2020-09-22 ENCOUNTER — Ambulatory Visit: Payer: Medicare Other | Admitting: Podiatry

## 2020-09-24 ENCOUNTER — Encounter: Payer: Self-pay | Admitting: Physical Medicine and Rehabilitation

## 2020-09-25 DIAGNOSIS — E119 Type 2 diabetes mellitus without complications: Secondary | ICD-10-CM | POA: Insufficient documentation

## 2020-09-25 DIAGNOSIS — E1142 Type 2 diabetes mellitus with diabetic polyneuropathy: Secondary | ICD-10-CM | POA: Insufficient documentation

## 2020-09-25 DIAGNOSIS — E7800 Pure hypercholesterolemia, unspecified: Secondary | ICD-10-CM | POA: Insufficient documentation

## 2020-09-25 DIAGNOSIS — F3340 Major depressive disorder, recurrent, in remission, unspecified: Secondary | ICD-10-CM | POA: Insufficient documentation

## 2020-09-29 ENCOUNTER — Ambulatory Visit: Payer: Medicare Other | Admitting: Podiatry

## 2020-11-04 ENCOUNTER — Other Ambulatory Visit: Payer: Self-pay

## 2020-11-04 ENCOUNTER — Encounter: Payer: Self-pay | Admitting: Podiatry

## 2020-11-04 ENCOUNTER — Ambulatory Visit: Payer: Medicare Other | Admitting: Podiatry

## 2020-11-04 DIAGNOSIS — M79675 Pain in left toe(s): Secondary | ICD-10-CM | POA: Diagnosis not present

## 2020-11-04 DIAGNOSIS — M79674 Pain in right toe(s): Secondary | ICD-10-CM

## 2020-11-04 DIAGNOSIS — E1149 Type 2 diabetes mellitus with other diabetic neurological complication: Secondary | ICD-10-CM | POA: Diagnosis not present

## 2020-11-04 DIAGNOSIS — B351 Tinea unguium: Secondary | ICD-10-CM

## 2020-11-04 DIAGNOSIS — E119 Type 2 diabetes mellitus without complications: Secondary | ICD-10-CM

## 2020-11-09 NOTE — Progress Notes (Signed)
Subjective:   [name], male   DOB: 80 y.o.   MRN: [id number]   HPI 80 year old male presents the office today for diabetic foot evaluation as well as on his nails and calluses trimmed.  He denies any ulcerations.  He said no recent treatment for his feet and has been ongoing chronic issues.  He also has neuropathy, is currently not taking gabapentin or other medications.   Review of Systems  All other systems reviewed and are negative.  Past Medical History:  Diagnosis Date  . Anxiety   . Arthritis   . BPH (benign prostatic hyperplasia)   . Depression   . GERD (gastroesophageal reflux disease)   . Hemorrhoid   . Hyperlipidemia   . Hypertension   . Sleep apnea    wears CPAP    Past Surgical History:  Procedure Laterality Date  . BACK SURGERY    . cataract with IOL OD    . Cataract with IOL OS- complications requiring  laser therapy and drops    . CHOLECYSTECTOMY    . NASAL SINUS SURGERY    . PARS PLANA VITRECTOMY Left 12/26/2012   Procedure: PARS PLANA VITRECTOMY WITH 25G REMOVAL/SUTURE INTRAOCULAR LENS;  Surgeon: Hayden Pedro, MD;  Location: [facility];  Service: Ophthalmology;  Laterality: Left;  . SEPTOPLASTY    . teeth extracted     '08  . TONSILLECTOMY    . VITERECTOMY Left 12/26/2012   Dr Zigmund Daniel     Current Outpatient Medications:  .  betamethasone dipropionate 9.32 % cream, 1 application, Disp: , Rfl:  .  triamcinolone lotion (KENALOG) 0.1 %, 1 application, Disp: , Rfl:  .  ALPRAZolam (XANAX) 0.5 MG tablet, TAKE 1 TABLET BY MOUTH AT BEDTIME AS NEEDED FOR ANXIETY, Disp: 90 tablet, Rfl: 2 .  amLODipine (NORVASC) 10 MG tablet, Take 10 mg by mouth daily., Disp: , Rfl:  .  amLODipine (NORVASC) 5 MG tablet, Take 1 tablet (5 mg total) by mouth daily., Disp: 90 tablet, Rfl: 3 .  amLODipine (NORVASC) 5 MG tablet, TAKE 1 TABLET(5 MG) BY MOUTH DAILY, Disp: 90 tablet, Rfl: 1 .  aspirin EC 81 MG tablet, Take 81 mg by mouth every evening., Disp: , Rfl:  .   atorvastatin (LIPITOR) 20 MG tablet, Take 1 tablet (20 mg total) by mouth daily at 6 PM., Disp: 90 tablet, Rfl: 3 .  B Complex, Folic Acid, TABS, Take 1 tablet by mouth daily., Disp: , Rfl:  .  Calcium Carbonate-Vitamin D (CALCIUM 600 + D PO), Take 1 tablet by mouth daily., Disp: , Rfl:  .  Calcium Carbonate-Vitamin D 600-200 MG-UNIT CAPS, Take 1 tablet by mouth daily., Disp: , Rfl:  .  cholecalciferol (VITAMIN D) 1000 UNITS tablet, Take 1,000 Units by mouth daily., Disp: , Rfl:  .  diclofenac (VOLTAREN) 50 MG EC tablet, Take 50 mg by mouth 2 (two) times daily., Disp: , Rfl:  .  diphenhydrAMINE (BENADRYL ALLERGY) 25 MG tablet, 1 tablet, Disp: , Rfl:  .  dorzolamide (TRUSOPT) 2 % ophthalmic solution, 1 drop 2 (two) times daily., Disp: , Rfl:  .  dorzolamide-timolol (COSOPT) 22.3-6.8 MG/ML ophthalmic solution, INSTILL 1 DROP IN OU Q 12 H, Disp: , Rfl: 3 .  finasteride (PROSCAR) 5 MG tablet, TAKE 1 TABLET BY MOUTH DAILY, Disp: 90 tablet, Rfl: 1 .  gabapentin (NEURONTIN) 100 MG capsule, Take by mouth., Disp: , Rfl:  .  Garlic 6712 MG CAPS, Take 1,000 mg by mouth daily.,  Disp: , Rfl:  .  HYDROcodone-acetaminophen (NORCO) 7.5-325 MG tablet, Take by mouth., Disp: , Rfl:  .  lisinopril (ZESTRIL) 2.5 MG tablet, Take 2.5 mg by mouth daily., Disp: , Rfl:  .  meclizine (ANTIVERT) 25 MG tablet, Take 1 tablet (25 mg total) by mouth 3 (three) times daily as needed for dizziness., Disp: 75 tablet, Rfl: 1 .  methocarbamol (ROBAXIN) 750 MG tablet, Take 750 mg by mouth every 6 (six) hours as needed., Disp: , Rfl:  .  omega-3 acid ethyl esters (LOVAZA) 1 g capsule, Take 1 capsule by mouth daily., Disp: , Rfl:  .  Omega-3 Fatty Acids (FISH OIL) 1000 MG CAPS, Take 1,000 mg by mouth daily., Disp: , Rfl:  .  omeprazole (PRILOSEC) 20 MG capsule, Take 20 mg by mouth daily., Disp: , Rfl:  .  silodosin (RAPAFLO) 8 MG CAPS capsule, Take 1 capsule (8 mg total) by mouth daily with breakfast., Disp: 30 capsule, Rfl: 11 .   tamsulosin (FLOMAX) 0.4 MG CAPS capsule, Take 0.4 mg by mouth daily., Disp: , Rfl:  .  venlafaxine XR (EFFEXOR-XR) 150 MG 24 hr capsule, TAKE ONE CAPSULE BY MOUTH EVERY DAY, Disp: 90 capsule, Rfl: 0 .  vitamin C (ASCORBIC ACID) 500 MG tablet, Take 500 mg by mouth daily., Disp: , Rfl:  .  Vitamins/Minerals TABS, Take 1 tablet by mouth daily., Disp: , Rfl:  .  Zinc 50 MG TABS, Take 50 mg by mouth daily., Disp: , Rfl:  .  zinc gluconate 50 MG tablet, Take by mouth., Disp: , Rfl:   Allergies  Allergen Reactions  . Quinapril Hcl     cough          Objective:  Physical Exam  General: AAO x3, NAD  Dermatological: Nails are hypertrophic, dystrophic, brittle, discolored, elongated 10. No surrounding redness or drainage. Tenderness nails 1-5 bilaterally. No open lesions identified today.   Vascular: Dorsalis Pedis artery and Posterior Tibial artery pedal pulses are 2/4 bilateral with immedate capillary fill time. There is no pain with calf compression, swelling, warmth, erythema.   Neruologic: Sensation decreased with Thorn[place] Mule monofilament  Musculoskeletal: Muscular strength 5/5 in all groups tested bilateral.     Assessment:   Symptomatic onychomycosis; type 2 diabetes with neuropathy.     Plan:  -Treatment options discussed including all alternatives, risks, and complications -Etiology of symptoms were discussed -Nails debrided 10 without complications or bleeding. -Daily foot inspection -Follow-up in 3 months or sooner if any problems arise. In the meantime, encouraged to call the office with any questions, concerns, change in symptoms.   Celesta Gentile, DPM

## 2020-11-10 ENCOUNTER — Ambulatory Visit: Payer: Medicare Other | Admitting: Physical Medicine and Rehabilitation

## 2020-12-04 ENCOUNTER — Other Ambulatory Visit: Payer: Self-pay | Admitting: Family Medicine

## 2020-12-08 ENCOUNTER — Other Ambulatory Visit: Payer: Self-pay | Admitting: Family Medicine

## 2020-12-08 DIAGNOSIS — I712 Thoracic aortic aneurysm, without rupture, unspecified: Secondary | ICD-10-CM

## 2020-12-25 ENCOUNTER — Ambulatory Visit
Admission: RE | Admit: 2020-12-25 | Discharge: 2020-12-25 | Disposition: A | Discharge status: Home / Self Care | Payer: Medicare Other | Source: Ambulatory Visit | Attending: Family Medicine | Admitting: Family Medicine

## 2020-12-25 ENCOUNTER — Other Ambulatory Visit: Payer: Self-pay | Admitting: Family Medicine

## 2020-12-25 ENCOUNTER — Other Ambulatory Visit: Payer: Self-pay

## 2020-12-25 DIAGNOSIS — I712 Thoracic aortic aneurysm, without rupture, unspecified: Secondary | ICD-10-CM

## 2020-12-25 MED ORDER — GADOBENATE DIMEGLUMINE 529 MG/ML IV SOLN
20.0000 mL | Freq: Once | INTRAVENOUS | Status: AC | PRN
  Administered 2020-12-25: 20 mL via INTRAVENOUS

## 2020-12-27 ENCOUNTER — Inpatient Hospital Stay: Admission: RE | Admit: 2020-12-27 | Payer: Medicare Other | Source: Ambulatory Visit

## 2021-01-05 ENCOUNTER — Other Ambulatory Visit: Payer: Self-pay

## 2021-01-05 ENCOUNTER — Ambulatory Visit: Payer: Medicare Other | Admitting: Podiatry

## 2021-01-05 DIAGNOSIS — M79674 Pain in right toe(s): Secondary | ICD-10-CM

## 2021-01-05 DIAGNOSIS — M79675 Pain in left toe(s): Secondary | ICD-10-CM

## 2021-01-05 DIAGNOSIS — E1149 Type 2 diabetes mellitus with other diabetic neurological complication: Secondary | ICD-10-CM

## 2021-01-05 DIAGNOSIS — B351 Tinea unguium: Secondary | ICD-10-CM | POA: Diagnosis not present

## 2021-01-05 DIAGNOSIS — Q828 Other specified congenital malformations of skin: Secondary | ICD-10-CM

## 2021-01-05 NOTE — Progress Notes (Signed)
Subjective: 80 y.o. returns the office today for painful, elongated, thickened toenails which he cannot trim himself. Denies any redness or drainage around the nails.  Also has calluses to his feet.  Is been using moisturizer which is helpful.  He also bought some inserts for his been helpful.  No open sores he reports.  Denies any acute changes since last appointment and no new complaints today. Denies any systemic complaints such as fevers, chills, nausea, vomiting.   A1c: 6.3  Objective: AAO 3, NAD DP/PT pulses palpable, CRT less than 3 seconds Nails hypertrophic, dystrophic, elongated, brittle, discolored 10. There is tenderness overlying the nails 1-5 bilaterally. There is no surrounding erythema or drainage along the nail sites. Hyperkeratotic lesions bilateral submetatarsal 4 and right submetatarsal 1.  No underlying ulceration drainage or signs of infection. No open lesions or pre-ulcerative lesions are identified. No pain with calf compression, swelling, warmth, erythema.  Assessment: Patient presents with symptomatic onychomycosis; hyperkeratotic lesions  Plan: -Treatment options including alternatives, risks, complications were discussed -Nails sharply debrided 10 without complication/bleeding. -Hyperkeratotic lesion sharply debrided x3 without any complications or bleeding. -Discussed daily foot inspection. If there are any changes, to call the office immediately.  -Follow-up in 3 months or sooner if any problems are to arise. In the meantime, encouraged to call the office with any questions, concerns, changes symptoms.  Celesta Gentile, DPM

## 2021-01-08 ENCOUNTER — Institutional Professional Consult (permissible substitution): Payer: Medicare Other | Admitting: Cardiothoracic Surgery

## 2021-01-08 ENCOUNTER — Other Ambulatory Visit: Payer: Self-pay

## 2021-01-08 VITALS — BP 118/75 | HR 66 | Resp 20 | Ht 71.0 in | Wt 300.0 lb

## 2021-01-08 DIAGNOSIS — I7121 Aneurysm of the ascending aorta, without rupture: Secondary | ICD-10-CM

## 2021-01-08 DIAGNOSIS — I712 Thoracic aortic aneurysm, without rupture: Secondary | ICD-10-CM

## 2021-01-09 NOTE — Progress Notes (Signed)
[address]       [place] [address]             [phone]     CARDIOTHORACIC SURGERY CONSULTATION REPORT  Referring Provider is Caren Macadam, MD Primary Cardiologist is No primary care provider on file. PCP is Caren Macadam, MD  Chief Complaint  Patient presents with  . Thoracic Aortic Aneurysm    Surgical consult, MRA Chest 12/25/20, Chest CT 05/07/20    HPI: 80 year old man presents for evaluation of ascending aortic aneurysm.  He states this was first detected approximately 4 years ago.  This was picked up incidentally.  He has no personal history of aneurysm or no significant family history of aneurysm.  He has no definable connective tissue disorder or syndrome.  According to his recollection, the aneurysm has been stable for some time.  His last evaluation was outside of our system therefore the images are not personally available.  Past Medical History:  Diagnosis Date  . Anxiety   . Arthritis   . BPH (benign prostatic hyperplasia)   . Depression   . GERD (gastroesophageal reflux disease)   . Hemorrhoid   . Hyperlipidemia   . Hypertension   . Sleep apnea    wears CPAP    Past Surgical History:  Procedure Laterality Date  . BACK SURGERY    . cataract with IOL OD    . Cataract with IOL OS- complications requiring  laser therapy and drops    . CHOLECYSTECTOMY    . NASAL SINUS SURGERY    . PARS PLANA VITRECTOMY Left 12/26/2012   Procedure: PARS PLANA VITRECTOMY WITH 25G REMOVAL/SUTURE INTRAOCULAR LENS;  Surgeon: Hayden Pedro, MD;  Location: [facility];  Service: Ophthalmology;  Laterality: Left;  . SEPTOPLASTY    . teeth extracted     '08  . TONSILLECTOMY    . VITERECTOMY Left 12/26/2012   Dr Zigmund Daniel    Family History  Problem Relation Age of Onset  . Hyperlipidemia Mother   . Hypertension Mother   . Cancer Father        lung cancer    Social History   Socioeconomic History  . Marital status: Married    Spouse name:  brenda  . Number of children: 3  . Years of education: 46  . Highest education level: Not on file  Occupational History  . Occupation: courier    Employer: [employer]  Tobacco Use  . Smoking status: Former Smoker    Years: 10.00  . Smokeless tobacco: Never Used  . Tobacco comment: quit 15 years ago  Substance and Sexual Activity  . Alcohol use: No  . Drug use: No  . Sexual activity: Not Currently  Other Topics Concern  . Not on file  Social History Narrative   [employer] [place], [employer]. Married 1966. 3 boys, - '68, '73, '74   3 grandchildren. 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 SO- medical problems.ACP -End of life: no cpr, no prolonged Ventilator support, would consider HD, no heroic life- sustaining therapy, e.g. Artificial feeding or hydration in the long term.    Retired in August 2014.   Social Determinants of Health   Financial Resource Strain: Not on file  Food Insecurity: Not on file  Transportation Needs: Not on file  Physical Activity: Not on file  Stress: Not on file  Social Connections: Not on file  Intimate Partner Violence: Not on file  Current Outpatient Medications  Medication Sig Dispense Refill  . ALPRAZolam (XANAX) 0.5 MG tablet TAKE 1 TABLET BY MOUTH AT BEDTIME AS NEEDED FOR ANXIETY 90 tablet 2  . amLODipine (NORVASC) 10 MG tablet Take 10 mg by mouth daily.    Marland Kitchen aspirin EC 81 MG tablet Take 81 mg by mouth every evening.    Marland Kitchen atorvastatin (LIPITOR) 20 MG tablet Take 1 tablet (20 mg total) by mouth daily at 6 PM. 90 tablet 3  . B Complex, Folic Acid, TABS Take 1 tablet by mouth daily.    . betamethasone dipropionate 1.82 % cream 1 application    . Calcium Carbonate-Vitamin D (CALCIUM 600 + D PO) Take 1 tablet by mouth daily.    . Calcium Carbonate-Vitamin D 600-200 MG-UNIT CAPS Take 1 tablet by mouth daily.    . cholecalciferol (VITAMIN D) 1000 UNITS tablet Take 1,000 Units by mouth daily.    . diclofenac  (VOLTAREN) 50 MG EC tablet Take 50 mg by mouth 2 (two) times daily.    . diphenhydrAMINE (BENADRYL ALLERGY) 25 MG tablet 1 tablet    . dorzolamide (TRUSOPT) 2 % ophthalmic solution 1 drop 2 (two) times daily.    . dorzolamide-timolol (COSOPT) 22.3-6.8 MG/ML ophthalmic solution INSTILL 1 DROP IN OU Q 12 H  3  . finasteride (PROSCAR) 5 MG tablet TAKE 1 TABLET BY MOUTH DAILY 90 tablet 1  . gabapentin (NEURONTIN) 100 MG capsule Take by mouth.    . Garlic 9937 MG CAPS Take 1,000 mg by mouth daily.    Marland Kitchen HYDROcodone-acetaminophen (NORCO) 7.5-325 MG tablet Take by mouth.    Marland Kitchen lisinopril (ZESTRIL) 2.5 MG tablet Take 2.5 mg by mouth daily.    . meclizine (ANTIVERT) 25 MG tablet Take 1 tablet (25 mg total) by mouth 3 (three) times daily as needed for dizziness. 75 tablet 1  . methocarbamol (ROBAXIN) 750 MG tablet Take 750 mg by mouth every 6 (six) hours as needed.    Marland Kitchen omega-3 acid ethyl esters (LOVAZA) 1 g capsule Take 1 capsule by mouth daily.    . Omega-3 Fatty Acids (FISH OIL) 1000 MG CAPS Take 1,000 mg by mouth daily.    Marland Kitchen omeprazole (PRILOSEC) 20 MG capsule Take 20 mg by mouth daily.    . silodosin (RAPAFLO) 8 MG CAPS capsule Take 1 capsule (8 mg total) by mouth daily with breakfast. 30 capsule 11  . tamsulosin (FLOMAX) 0.4 MG CAPS capsule Take 0.4 mg by mouth daily.    Marland Kitchen triamcinolone lotion (KENALOG) 0.1 % 1 application    . venlafaxine XR (EFFEXOR-XR) 150 MG 24 hr capsule TAKE ONE CAPSULE BY MOUTH EVERY DAY 90 capsule 0  . vitamin C (ASCORBIC ACID) 500 MG tablet Take 500 mg by mouth daily.    . Vitamins/Minerals TABS Take 1 tablet by mouth daily.    . Zinc 50 MG TABS Take 50 mg by mouth daily.     No current facility-administered medications for this visit.    Allergies  Allergen Reactions  . Quinapril Hcl     cough      Review of Systems:   General:  60 pound weight loss over the last 6 months due to dieting  Cardiac:  Denies dysrhythmias or shortness of  breath  Respiratory:  Sleep apnea with CPAP use every night  GI:   No abdominal pain  GU:   Denies prostate or kidney disease  Vascular:  No claudication or DVTs  Neuro:   No history  of strokes or TIAs  Musculoskeletal: No arthritis or joint discomfort  Skin:   History of itching and skin infection now healed  Psych:   Primary caretaker for wife  Eyes:   Negative  ENT:   Negative  Hematologic:  Negative  Endocrine:   diabetes,  check CBG's at home     Physical Exam:   BP 118/75   Pulse 66   Resp 20   Ht 5\' 11"  (1.803 m)   Wt 136.1 kg   SpO2 95% Comment: RA  BMI 41.84 kg/m   General:    well-appearing  HEENT:  Unremarkable   Neck:   no JVD, no bruits, no adenopathy   Chest:   clear to auscultation, symmetrical breath sounds, no wheezes, no rhonchi   CV:   RRR, no detectable murmur   Abdomen:  soft, non-tender, no masses   Extremities:  warm, well-perfused, pulses symmetrical and intact, no LE edema  Rectal/GU  Deferred  Neuro:   Grossly non-focal and symmetrical throughout  Skin:   Clean and dry, no rashes, no breakdown   Diagnostic Tests:  I have reviewed the available reports from his recent CT scan but the images are not  available for me to review by direct visualization   Impression:  80 year old male with small (sub-5 cm) ascending aortic aneurysm presumably stable in maximal diameter for the past several years   Plan:  Follow-up in 1 year with repeat noncontrast chest CT Continue to observe blood pressure control Report any severe back or chest pain to local emergency department   I spent in excess of 30 minutes during the conduct of this office consultation and >50% of this time involved direct face-to-face encounter with the patient for counseling and/or coordination of their care.          Level 3 Office Consult = 40 minutes         Level 4 Office Consult = 60 minutes         Level 5 Office Consult = 80 minutes  B.  Murvin Natal,  MD 01/09/2021 1:30 PM

## 2021-03-23 ENCOUNTER — Ambulatory Visit: Payer: Medicare Other | Admitting: Podiatry

## 2021-03-23 ENCOUNTER — Encounter: Payer: Self-pay | Admitting: Podiatry

## 2021-03-23 ENCOUNTER — Other Ambulatory Visit: Payer: Self-pay

## 2021-03-23 DIAGNOSIS — M79675 Pain in left toe(s): Secondary | ICD-10-CM | POA: Diagnosis not present

## 2021-03-23 DIAGNOSIS — Q828 Other specified congenital malformations of skin: Secondary | ICD-10-CM

## 2021-03-23 DIAGNOSIS — B351 Tinea unguium: Secondary | ICD-10-CM | POA: Diagnosis not present

## 2021-03-23 DIAGNOSIS — M79674 Pain in right toe(s): Secondary | ICD-10-CM

## 2021-03-23 DIAGNOSIS — E1149 Type 2 diabetes mellitus with other diabetic neurological complication: Secondary | ICD-10-CM | POA: Diagnosis not present

## 2021-03-25 NOTE — Progress Notes (Signed)
Subjective: 80 y.o. returns the office today for painful, elongated, thickened toenails which he cannot trim himself and for calluses. Denies any redness or drainage around the nails.   No open sores/ulcers he reports.  Denies any acute changes since last appointment and no new complaints today. Denies any systemic complaints such as fevers, chills, nausea, vomiting.   A1c: 6.3  Objective: AAO 3, NAD DP/PT pulses palpable, CRT less than 3 seconds Nails hypertrophic, dystrophic, elongated, brittle, discolored 10. There is tenderness overlying the nails 1-5 bilaterally. There is no surrounding erythema or drainage along the nail sites. Hyperkeratotic lesions bilateral submetatarsal 4 and right submetatarsal 1.  No underlying ulceration drainage or signs of infection. No open lesions or pre-ulcerative lesions are identified. No pain with calf compression, swelling, warmth, erythema.  Assessment: Patient presents with symptomatic onychomycosis; hyperkeratotic lesions  Plan: -Treatment options including alternatives, risks, complications were discussed -Nails sharply debrided 10 without complication/bleeding. -Hyperkeratotic lesion sharply debrided x3 without any complications or bleeding. -Discussed daily foot inspection. If there are any changes, to call the office immediately.  -Follow-up in 3 months or sooner if any problems are to arise. In the meantime, encouraged to call the office with any questions, concerns, changes symptoms.  Celesta Gentile, DPM

## 2021-04-07 ENCOUNTER — Ambulatory Visit: Payer: Medicare Other | Admitting: Podiatry

## 2021-05-27 DIAGNOSIS — G4733 Obstructive sleep apnea (adult) (pediatric): Secondary | ICD-10-CM | POA: Diagnosis not present

## 2021-06-17 DIAGNOSIS — H401131 Primary open-angle glaucoma, bilateral, mild stage: Secondary | ICD-10-CM | POA: Diagnosis not present

## 2021-06-23 ENCOUNTER — Other Ambulatory Visit: Payer: Self-pay

## 2021-06-23 ENCOUNTER — Encounter: Payer: Self-pay | Admitting: Podiatry

## 2021-06-23 ENCOUNTER — Ambulatory Visit: Payer: Medicare Other | Admitting: Podiatry

## 2021-06-23 DIAGNOSIS — M7742 Metatarsalgia, left foot: Secondary | ICD-10-CM | POA: Diagnosis not present

## 2021-06-23 DIAGNOSIS — B351 Tinea unguium: Secondary | ICD-10-CM

## 2021-06-23 DIAGNOSIS — Q828 Other specified congenital malformations of skin: Secondary | ICD-10-CM

## 2021-06-23 DIAGNOSIS — M79675 Pain in left toe(s): Secondary | ICD-10-CM | POA: Diagnosis not present

## 2021-06-23 DIAGNOSIS — M7741 Metatarsalgia, right foot: Secondary | ICD-10-CM | POA: Diagnosis not present

## 2021-06-23 DIAGNOSIS — M722 Plantar fascial fibromatosis: Secondary | ICD-10-CM | POA: Diagnosis not present

## 2021-06-23 DIAGNOSIS — M79674 Pain in right toe(s): Secondary | ICD-10-CM

## 2021-06-23 DIAGNOSIS — E1149 Type 2 diabetes mellitus with other diabetic neurological complication: Secondary | ICD-10-CM

## 2021-06-28 NOTE — Progress Notes (Signed)
Subjective: 80 y.o. returns the office today for painful, elongated, thickened toenails which he cannot trim himself and for calluses. Denies any redness or drainage around the nails.   No open sores/ulcers he reports.  Today he reports 2 to 58-monthhistory of discomfort in the bottom of his right foot which occurs intermittently.  He denies any recent injury.  No swelling.  He points to the ball of the foot mostly with the discomfort and swelling of calluses.  Denies any systemic complaints such as fevers, chills, nausea, vomiting.   A1c: 6.3  Objective: AAO 3, NAD DP/PT pulses palpable, CRT less than 3 seconds Nails hypertrophic, dystrophic, elongated, brittle, discolored 10. There is tenderness overlying the nails 1-5 bilaterally. There is no surrounding erythema or drainage along the nail sites. Hyperkeratotic lesions bilateral submetatarsal 4 and right submetatarsal 1.  No underlying ulceration drainage or signs of infection.  Prominence of metatarsal heads plantarly after fat pad.  Not able to elicit any other areas of discomfort but subjectively upon palpation of the plantar fascia as well as the arch of the foot he does get some discomfort.  No significant tenderness on exam.  No edema, erythema.  No area of pinpoint tenderness.  MMT 5/5. No open lesions or pre-ulcerative lesions are identified. No pain with calf compression, swelling, warmth, erythema.  Assessment: Patient presents with symptomatic onychomycosis; hyperkeratotic lesions; metatarsalgia, tendinitis  Plan: -Treatment options including alternatives, risks, complications were discussed -Nails sharply debrided 10 without complication/bleeding. -Hyperkeratotic lesion sharply debrided x3 without any complications or bleeding. -I discussed when she modifications good arch support.  Discussed general stretching exercises as well. -Discussed daily foot inspection. If there are any changes, to call the office immediately.   -Follow-up in 3 months or sooner if any problems are to arise. In the meantime, encouraged to call the office with any questions, concerns, changes symptoms.  MCelesta Gentile DPM

## 2021-08-28 DIAGNOSIS — I10 Essential (primary) hypertension: Secondary | ICD-10-CM | POA: Diagnosis not present

## 2021-08-28 DIAGNOSIS — E7800 Pure hypercholesterolemia, unspecified: Secondary | ICD-10-CM | POA: Diagnosis not present

## 2021-08-28 DIAGNOSIS — Z23 Encounter for immunization: Secondary | ICD-10-CM | POA: Diagnosis not present

## 2021-08-28 DIAGNOSIS — I712 Thoracic aortic aneurysm, without rupture, unspecified: Secondary | ICD-10-CM | POA: Diagnosis not present

## 2021-08-28 DIAGNOSIS — E118 Type 2 diabetes mellitus with unspecified complications: Secondary | ICD-10-CM | POA: Diagnosis not present

## 2021-09-17 DIAGNOSIS — G4733 Obstructive sleep apnea (adult) (pediatric): Secondary | ICD-10-CM | POA: Diagnosis not present

## 2021-09-25 ENCOUNTER — Telehealth: Payer: Self-pay | Admitting: Podiatry

## 2021-09-25 NOTE — Telephone Encounter (Signed)
Patient called he said he was given a pad for his one foot, and he was inquiring if he could get one for the opposite foot to balance it out. Please advise

## 2021-10-27 ENCOUNTER — Ambulatory Visit: Payer: Medicare Other | Admitting: Podiatry

## 2021-10-27 ENCOUNTER — Other Ambulatory Visit: Payer: Self-pay

## 2021-10-27 DIAGNOSIS — B351 Tinea unguium: Secondary | ICD-10-CM | POA: Diagnosis not present

## 2021-10-27 DIAGNOSIS — Q828 Other specified congenital malformations of skin: Secondary | ICD-10-CM | POA: Diagnosis not present

## 2021-10-27 DIAGNOSIS — M79675 Pain in left toe(s): Secondary | ICD-10-CM

## 2021-10-27 DIAGNOSIS — E119 Type 2 diabetes mellitus without complications: Secondary | ICD-10-CM

## 2021-10-27 DIAGNOSIS — M79674 Pain in right toe(s): Secondary | ICD-10-CM | POA: Diagnosis not present

## 2021-10-27 DIAGNOSIS — E1149 Type 2 diabetes mellitus with other diabetic neurological complication: Secondary | ICD-10-CM

## 2021-10-27 MED ORDER — GABAPENTIN 100 MG PO CAPS: 100.0000 mg | ORAL_CAPSULE | Freq: Every day | ORAL | 0 refills | Status: DC

## 2021-10-27 NOTE — Patient Instructions (Signed)
Gabapentin Capsules or Tablets ?What is this medication? ?GABAPENTIN (GA ba pen tin) treats nerve pain. It may also be used to prevent and control seizures in people with epilepsy. It works by calming overactive nerves in your body. ?This medicine may be used for other purposes; ask your health care provider or pharmacist if you have questions. ?COMMON BRAND NAME(S): Active-PAC with Gabapentin, Gabarone, Gralise, Neurontin ?What should I tell my care team before I take this medication? ?They need to know if you have any of these conditions: ?Alcohol or substance use disorder ?Kidney disease ?Lung or breathing disease ?Suicidal thoughts, plans, or attempt; a previous suicide attempt by you or a family member ?An unusual or allergic reaction to gabapentin, other medications, foods, dyes, or preservatives ?Pregnant or trying to get pregnant ?Breast-feeding ?How should I use this medication? ?Take this medication by mouth with a glass of water. Follow the directions on the prescription label. You can take it with or without food. If it upsets your stomach, take it with food. Take your medication at regular intervals. Do not take it more often than directed. Do not stop taking except on your care team's advice. ?If you are directed to break the 600 or 800 mg tablets in half as part of your dose, the extra half tablet should be used for the next dose. If you have not used the extra half tablet within 28 days, it should be thrown away. ?A special MedGuide will be given to you by the pharmacist with each prescription and refill. Be sure to read this information carefully each time. ?Talk to your care team about the use of this medication in children. While this medication may be prescribed for children as young as 3 years for selected conditions, precautions do apply. ?Overdosage: If you think you have taken too much of this medicine contact a poison control center or emergency room at once. ?NOTE: This medicine is only for  you. Do not share this medicine with others. ?What if I miss a dose? ?If you miss a dose, take it as soon as you can. If it is almost time for your next dose, take only that dose. Do not take double or extra doses. ?What may interact with this medication? ?Alcohol ?Antihistamines for allergy, cough, and cold ?Certain medications for anxiety or sleep ?Certain medications for depression like amitriptyline, fluoxetine, sertraline ?Certain medications for seizures like phenobarbital, primidone ?Certain medications for stomach problems ?General anesthetics like halothane, isoflurane, methoxyflurane, propofol ?Local anesthetics like lidocaine, pramoxine, tetracaine ?Medications that relax muscles for surgery ?Opioid medications for pain ?Phenothiazines like chlorpromazine, mesoridazine, prochlorperazine, thioridazine ?This list may not describe all possible interactions. Give your health care provider a list of all the medicines, herbs, non-prescription drugs, or dietary supplements you use. Also tell them if you smoke, drink alcohol, or use illegal drugs. Some items may interact with your medicine. ?What should I watch for while using this medication? ?Visit your care team for regular checks on your progress. You may want to keep a record at home of how you feel your condition is responding to treatment. You may want to share this information with your care team at each visit. You should contact your care team if your seizures get worse or if you have any new types of seizures. Do not stop taking this medication or any of your seizure medications unless instructed by your care team. Stopping your medication suddenly can increase your seizures or their severity. ?This medication may cause serious skin   reactions. They can happen weeks to months after starting the medication. Contact your care team right away if you notice fevers or flu-like symptoms with a rash. The rash may be red or purple and then turn into blisters or  peeling of the skin. Or, you might notice a red rash with swelling of the face, lips or lymph nodes in your neck or under your arms. ?Wear a medical identification bracelet or chain if you are taking this medication for seizures. Carry a card that lists all your medications. ?This medication may affect your coordination, reaction time, or judgment. Do not drive or operate machinery until you know how this medication affects you. Sit up or stand slowly to reduce the risk of dizzy or fainting spells. Drinking alcohol with this medication can increase the risk of these side effects. ?Your mouth may get dry. Chewing sugarless gum or sucking hard candy, and drinking plenty of water may help. ?Watch for new or worsening thoughts of suicide or depression. This includes sudden changes in mood, behaviors, or thoughts. These changes can happen at any time but are more common in the beginning of treatment or after a change in dose. Call your care team right away if you experience these thoughts or worsening depression. ?If you become pregnant while using this medication, you may enroll in the [place] Antiepileptic Drug Pregnancy Registry by calling 1-888-233-2334. This registry collects information about the safety of antiepileptic medication use during pregnancy. ?What side effects may I notice from receiving this medication? ?Side effects that you should report to your care team as soon as possible: ?Allergic reactions or angioedema--skin rash, itching, hives, swelling of the face, eyes, lips, tongue, arms, or legs, trouble swallowing or breathing ?Rash, fever, and swollen lymph nodes ?Thoughts of suicide or self harm, worsening mood, feelings of depression ?Trouble breathing ?Unusual changes in mood or behavior in children after use such as difficulty concentrating, hostility, or restlessness ?Side effects that usually do not require medical attention (report to your care team if they continue or are  bothersome): ?Dizziness ?Drowsiness ?Nausea ?Swelling of ankles, feet, or hands ?Vomiting ?This list may not describe all possible side effects. Call your doctor for medical advice about side effects. You may report side effects to FDA at 1-800-FDA-1088. ?Where should I keep my medication? ?Keep out of reach of children and pets. ?Store at room temperature between 15 and 30 degrees C (59 and 86 degrees F). Get rid of any unused medication after the expiration date. ?This medication may cause accidental overdose and death if taken by other adults, children, or pets. ?To get rid of medications that are no longer needed or have expired: ?Take the medication to a medication take-back program. Check with your pharmacy or law enforcement to find a location. ?If you cannot return the medication, check the label or package insert to see if the medication should be thrown out in the garbage or flushed down the toilet. If you are not sure, ask your care team. If it is safe to put it in the trash, empty the medication out of the container. Mix the medication with cat litter, dirt, coffee grounds, or other unwanted substance. Seal the mixture in a bag or container. Put it in the trash. ?NOTE: This sheet is a summary. It may not cover all possible information. If you have questions about this medicine, talk to your doctor, pharmacist, or health care provider. ?? 2022 Elsevier/Gold Standard (2021-03-30 00:00:00) ? ?

## 2021-10-29 DIAGNOSIS — J0140 Acute pansinusitis, unspecified: Secondary | ICD-10-CM | POA: Diagnosis not present

## 2021-10-31 NOTE — Progress Notes (Signed)
Subjective: 81 y.o. returns the office today for yearly diabetic foot exam as well as for painful, elongated, thickened toenails which he cannot trim himself and for calluses.  He states the calluses have been hurting.  He does get some numbness and tingling which has been bothersome.  He has not had any medications for this recently.  Denies any swelling or redness or any drainage.  He has no other concerns today.    A1c: 6.3  Objective: AAO 3, NAD DP/PT pulses palpable, CRT less than 3 seconds Sensation decreased with Semmes Weinstein monofilament. Burning, tingling Nails hypertrophic, dystrophic, elongated, brittle, discolored 10. There is tenderness overlying the nails 1-5 bilaterally. There is no surrounding erythema or drainage along the nail sites. Hyperkeratotic lesions bilateral submetatarsal 4 and right submetatarsal 1.  No underlying ulceration drainage or signs of infection.  Prominence of metatarsal heads plantarly after fat pad.  MMT 5/5. No open lesions or pre-ulcerative lesions are identified. No pain with calf compression, swelling, warmth, erythema.  Assessment: Patient presents with symptomatic onychomycosis; hyperkeratotic lesions; neuropathy  Plan: -Treatment options including alternatives, risks, complications were discussed -Nails sharply debrided 10 without complication/bleeding. -Hyperkeratotic lesion sharply debrided x3 without any complications or bleeding.  Continue moisturizer and offloading. -Discussed treatment options for neuropathy and we discussed treatment options.  He does agree and want to start gabapentin.  This was ordered today.  Discussed side effects of medication. -Discussed daily foot inspection. If there are any changes, to call the office immediately.  -Follow-up in 3 months or sooner if any problems are to arise. In the meantime, encouraged to call the office with any questions, concerns, changes symptoms.  Celesta Gentile, DPM

## 2021-11-11 ENCOUNTER — Telehealth: Payer: Self-pay | Admitting: *Deleted

## 2021-11-20 ENCOUNTER — Other Ambulatory Visit: Payer: Self-pay | Admitting: Cardiothoracic Surgery

## 2021-11-20 DIAGNOSIS — I7121 Aneurysm of the ascending aorta, without rupture: Secondary | ICD-10-CM

## 2021-12-15 NOTE — Telephone Encounter (Signed)
Error message

## 2021-12-16 DIAGNOSIS — H401131 Primary open-angle glaucoma, bilateral, mild stage: Secondary | ICD-10-CM | POA: Diagnosis not present

## 2021-12-17 DIAGNOSIS — G4733 Obstructive sleep apnea (adult) (pediatric): Secondary | ICD-10-CM | POA: Diagnosis not present

## 2022-01-04 ENCOUNTER — Ambulatory Visit: Payer: Medicare Other | Admitting: Cardiothoracic Surgery

## 2022-01-04 ENCOUNTER — Ambulatory Visit
Admission: RE | Admit: 2022-01-04 | Discharge: 2022-01-04 | Disposition: A | Discharge status: Home / Self Care | Payer: Medicare Other | Source: Ambulatory Visit | Attending: Cardiothoracic Surgery | Admitting: Cardiothoracic Surgery

## 2022-01-04 DIAGNOSIS — I7121 Aneurysm of the ascending aorta, without rupture: Secondary | ICD-10-CM | POA: Diagnosis not present

## 2022-01-05 ENCOUNTER — Other Ambulatory Visit: Payer: Self-pay | Admitting: Family Medicine

## 2022-01-05 DIAGNOSIS — I712 Thoracic aortic aneurysm, without rupture, unspecified: Secondary | ICD-10-CM

## 2022-01-07 ENCOUNTER — Ambulatory Visit: Payer: Medicare Other | Admitting: Cardiothoracic Surgery

## 2022-01-07 ENCOUNTER — Encounter: Payer: Self-pay | Admitting: Cardiothoracic Surgery

## 2022-01-07 VITALS — BP 132/76 | HR 68 | Resp 20 | Ht 71.0 in | Wt 250.0 lb

## 2022-01-07 DIAGNOSIS — M314 Aortic arch syndrome [Takayasu]: Secondary | ICD-10-CM | POA: Diagnosis not present

## 2022-01-07 DIAGNOSIS — I7121 Aneurysm of the ascending aorta, without rupture: Secondary | ICD-10-CM | POA: Diagnosis not present

## 2022-01-07 NOTE — Progress Notes (Signed)
PCP is Caren Macadam, MD ?Referring Provider is Caren Macadam, MD ? ?Chief Complaint  ?Patient presents with  ? Thoracic Aortic Aneurysm  ?  1 year f/u with Chest CT 01/04/22 ?  ? ? ?HPI: ?81 year old hypertensive diabetic returns for follow-up surveillance scan and exam of an asymptomatic fusiform ascending aneurysm.  He was first noted incidentally on a scan in 2015.  It measured approximately 4.0 cm diameter.  He has been stable since that time.  He remains asymptomatic.  He remains compliant with his blood pressure medications including amlodipine and lisinopril.  He also is on aspirin 81 mg and Lipitor. ? ?Patient denies any symptoms of chest pain or dyspnea on exertion.  He stopped smoking 40 years ago. ? ?I reviewed the images of his last CT scan of the chest performed on March 23 which shows the ascending aorta remains stable at 4.0 cm.  There is no mural thickening or penetrating ulcer.  Lung windows show no suspicious pulmonary nodules or mediastinal adenopathy. ? ?Past Medical History:  ?Diagnosis Date  ? Anxiety   ? Arthritis   ? BPH (benign prostatic hyperplasia)   ? Depression   ? GERD (gastroesophageal reflux disease)   ? Hemorrhoid   ? Hyperlipidemia   ? Hypertension   ? Sleep apnea   ? wears CPAP  ? ? ?Past Surgical History:  ?Procedure Laterality Date  ? BACK SURGERY    ? cataract with IOL OD    ? Cataract with IOL OS- complications requiring  laser therapy and drops    ? CHOLECYSTECTOMY    ? NASAL SINUS SURGERY    ? PARS PLANA VITRECTOMY Left 12/26/2012  ? Procedure: PARS PLANA VITRECTOMY WITH 25G REMOVAL/SUTURE INTRAOCULAR LENS;  Surgeon: Hayden Pedro, MD;  Location: [facility];  Service: Ophthalmology;  Laterality: Left;  ? SEPTOPLASTY    ? teeth extracted    ? '08  ? TONSILLECTOMY    ? VITERECTOMY Left 12/26/2012  ? Dr Zigmund Daniel  ? ? ?Family History  ?Problem Relation Age of Onset  ? Hyperlipidemia Mother   ? Hypertension Mother   ? Cancer Father   ?     lung cancer  ? ? ?Social History ?Social  History  ? ?Tobacco Use  ? Smoking status: Former  ?  Years: 10.00  ?  Types: Cigarettes  ? Smokeless tobacco: Never  ? Tobacco comments:  ?  quit 15 years ago  ?Substance Use Topics  ? Alcohol use: No  ? Drug use: No  ? ? ?Current Outpatient Medications  ?Medication Sig Dispense Refill  ? ACCU-CHEK GUIDE test strip USE AS DIRECTED ONCE A DAY    ? ALPRAZolam (XANAX) 0.5 MG tablet TAKE 1 TABLET BY MOUTH AT BEDTIME AS NEEDED FOR ANXIETY 90 tablet 2  ? amLODipine (NORVASC) 10 MG tablet Take 10 mg by mouth daily.    ? amLODipine (NORVASC) 10 MG tablet Take 1 tablet by mouth daily.    ? amLODipine (NORVASC) 10 MG tablet Take by mouth.    ? aspirin EC 81 MG tablet Take 81 mg by mouth every evening.    ? atorvastatin (LIPITOR) 20 MG tablet Take 1 tablet (20 mg total) by mouth daily at 6 PM. 90 tablet 3  ? atorvastatin (LIPITOR) 40 MG tablet Take 1 tablet by mouth daily.    ? B Complex, Folic Acid, TABS Take 1 tablet by mouth daily.    ? betamethasone dipropionate 4.70 % cream 1 application    ?  Calcium Carbonate-Vitamin D (CALCIUM 600 + D PO) Take 1 tablet by mouth daily.    ? Calcium Carbonate-Vitamin D 600-200 MG-UNIT CAPS Take 1 tablet by mouth daily.    ? cholecalciferol (VITAMIN D) 1000 UNITS tablet Take 1,000 Units by mouth daily.    ? diclofenac (VOLTAREN) 50 MG EC tablet Take 50 mg by mouth 2 (two) times daily.    ? diphenhydrAMINE (BENADRYL ALLERGY) 25 MG tablet 1 tablet    ? diphenhydrAMINE (SOMINEX) 25 MG tablet 1 tablet    ? dorzolamide (TRUSOPT) 2 % ophthalmic solution 1 drop 2 (two) times daily.    ? dorzolamide-timolol (COSOPT) 22.3-6.8 MG/ML ophthalmic solution INSTILL 1 DROP IN OU Q 12 H  3  ? finasteride (PROSCAR) 5 MG tablet TAKE 1 TABLET BY MOUTH DAILY 90 tablet 1  ? gabapentin (NEURONTIN) 100 MG capsule Take by mouth.    ? gabapentin (NEURONTIN) 100 MG capsule Take 1 capsule (100 mg total) by mouth at bedtime. 90 capsule 0  ? Garlic 4970 MG CAPS Take 1,000 mg by mouth daily.    ?  HYDROcodone-acetaminophen (NORCO) 7.5-325 MG tablet Take by mouth.    ? lisinopril (ZESTRIL) 2.5 MG tablet Take 2.5 mg by mouth daily.    ? meclizine (ANTIVERT) 25 MG tablet Take 1 tablet (25 mg total) by mouth 3 (three) times daily as needed for dizziness. 75 tablet 1  ? methocarbamol (ROBAXIN) 750 MG tablet Take 750 mg by mouth every 6 (six) hours as needed.    ? omega-3 acid ethyl esters (LOVAZA) 1 g capsule Take 1 capsule by mouth daily.    ? Omega-3 Fatty Acids (FISH OIL) 1000 MG CAPS Take 1,000 mg by mouth daily.    ? omeprazole (PRILOSEC) 20 MG capsule Take 20 mg by mouth daily.    ? silodosin (RAPAFLO) 8 MG CAPS capsule Take 1 capsule (8 mg total) by mouth daily with breakfast. 30 capsule 11  ? tamsulosin (FLOMAX) 0.4 MG CAPS capsule Take 0.4 mg by mouth daily.    ? tiZANidine (ZANAFLEX) 4 MG tablet 1/2 tablet as needed    ? tiZANidine (ZANAFLEX) 4 MG tablet Take by mouth.    ? triamcinolone lotion (KENALOG) 0.1 % 1 application    ? venlafaxine XR (EFFEXOR-XR) 150 MG 24 hr capsule TAKE ONE CAPSULE BY MOUTH EVERY DAY 90 capsule 0  ? vitamin C (ASCORBIC ACID) 500 MG tablet Take 500 mg by mouth daily.    ? Vitamins/Minerals TABS Take 1 tablet by mouth daily.    ? Zinc 50 MG TABS Take 50 mg by mouth daily.    ? ?No current facility-administered medications for this visit.  ? ? ?Allergies  ?Allergen Reactions  ? Quinapril Hcl   ?  cough  ? Quinapril Hcl Other (See Comments)  ?  Cough; "Threw me for a loop"  ? ? ?Review of Systems ? ?Patient remains active and is gardening and tries to take a15-minute walk every day.Marland Kitchen  He lives with his wife.  He does not check his blood pressure at home. ?He has had previous back surgery some residual problems. ?He denies chest pain or shortness of breath ?He denies syncope or TIA-like symptoms ?He denies any palpitations or difficulty swallowing ?No history of bleeding issues ?Mild nocturia ? ? ?BP 132/76   Pulse 68   Resp 20   Ht '5\' 11"'$  (1.803 m)   Wt 250 lb (113.4 kg)    SpO2 97% Comment: RA  BMI 34.87 kg/m?  ?  Physical Exam ?  ?   Exam ? ?  General- alert and comfortable ?   Neck- no JVD, no cervical adenopathy palpable, no carotid bruit ?  Lungs- clear without rales, wheezes ?  Cor- regular rate and rhythm, no murmur , gallop ?  Abdomen- soft, non-tender ?  Extremities - warm, non-tender, minimal edema ?  Neuro- oriented, appropriate, no focal weakness ? ? ?Diagnostic Tests: ?CT scan images personally reviewed from March 27.  Stable 4.0 cm fusiform ascending aneurysm. ?No at risk pulmonary nodules noted ? ?Impression: ?Stable asymptomatic small ascending fusiform aneurysm ?Hypertension controlled on medication ?Type 2 diabetes mellitus ? ?Plan: ?Patient was reassured that the aortic issue is at low risk and should not require surgery as long as he is compliant with his blood pressure medications continue the healthy lifestyle..  We will continue surveillance scans and will see the patient back in 18 months. ? ?Dahlia Byes, MD ?[facility] ?[phone] ? ?

## 2022-01-26 ENCOUNTER — Ambulatory Visit: Payer: Medicare Other | Admitting: Podiatry

## 2022-01-26 DIAGNOSIS — Q828 Other specified congenital malformations of skin: Secondary | ICD-10-CM

## 2022-01-26 DIAGNOSIS — M79674 Pain in right toe(s): Secondary | ICD-10-CM | POA: Diagnosis not present

## 2022-01-26 DIAGNOSIS — M79675 Pain in left toe(s): Secondary | ICD-10-CM

## 2022-01-26 DIAGNOSIS — E1149 Type 2 diabetes mellitus with other diabetic neurological complication: Secondary | ICD-10-CM

## 2022-01-26 DIAGNOSIS — B351 Tinea unguium: Secondary | ICD-10-CM | POA: Diagnosis not present

## 2022-01-31 NOTE — Progress Notes (Signed)
Subjective: ?81 y.o. returns the office today for yearly diabetic foot exam as well as for painful, elongated, thickened toenails which he cannot trim himself and for calluses.  He states that the metatarsal pads are helpful (he likes the gray ones).  His last blood sugar was 103.  Denies any open sores.  No other concerns. ? ?Objective: ?AAO ?3, NAD ?DP/PT pulses palpable, CRT less than 3 seconds ?Sensation decreased with Semmes Weinstein monofilament. ?Nails hypertrophic, dystrophic, elongated, brittle, discolored ?10. There is tenderness overlying the nails 1-5 bilaterally. There is no surrounding erythema or drainage along the nail sites. ?Hyperkeratotic lesions noted submetatarsal 4 on the left side there is some dried blood present underneath the callus but there is no skin breakdown.  Also hyperkeratotic lesion noted right submetatarsal 1 and 5.  There is no underlying ulceration. ?Prominence of metatarsal heads plantarly after fat pad.  MMT 5/5. ?No open lesions or pre-ulcerative lesions are identified. ?No pain with calf compression, swelling, warmth, erythema. ? ?Assessment: ?Patient presents with symptomatic onychomycosis; hyperkeratotic lesions; neuropathy ? ?Plan: ?-Treatment options including alternatives, risks, complications were discussed ?-Nails sharply debrided ?10 without complication/bleeding. ?-Hyperkeratotic lesion sharply debrided x 4 without any complications or bleeding.  Continue moisturizer and offloading. ?-Gabapentin for neuropathy. ?-Discussed daily foot inspection. If there are any changes, to call the office immediately.  ?-Follow-up in 3 months or sooner if any problems are to arise. In the meantime, encouraged to call the office with any questions, concerns, changes symptoms. ? ?Celesta Gentile, DPM ? ?

## 2022-02-22 ENCOUNTER — Other Ambulatory Visit: Payer: Medicare Other

## 2022-03-04 DIAGNOSIS — G4733 Obstructive sleep apnea (adult) (pediatric): Secondary | ICD-10-CM | POA: Diagnosis not present

## 2022-03-04 DIAGNOSIS — E119 Type 2 diabetes mellitus without complications: Secondary | ICD-10-CM | POA: Diagnosis not present

## 2022-03-04 DIAGNOSIS — Z9989 Dependence on other enabling machines and devices: Secondary | ICD-10-CM | POA: Diagnosis not present

## 2022-03-04 DIAGNOSIS — R011 Cardiac murmur, unspecified: Secondary | ICD-10-CM | POA: Diagnosis not present

## 2022-03-10 DIAGNOSIS — E119 Type 2 diabetes mellitus without complications: Secondary | ICD-10-CM | POA: Diagnosis not present

## 2022-03-17 DIAGNOSIS — G4733 Obstructive sleep apnea (adult) (pediatric): Secondary | ICD-10-CM | POA: Diagnosis not present

## 2022-04-27 ENCOUNTER — Ambulatory Visit: Payer: Medicare Other | Admitting: Podiatry

## 2022-04-27 DIAGNOSIS — E1149 Type 2 diabetes mellitus with other diabetic neurological complication: Secondary | ICD-10-CM

## 2022-04-27 DIAGNOSIS — Q828 Other specified congenital malformations of skin: Secondary | ICD-10-CM | POA: Diagnosis not present

## 2022-04-27 DIAGNOSIS — M79675 Pain in left toe(s): Secondary | ICD-10-CM

## 2022-04-27 DIAGNOSIS — B351 Tinea unguium: Secondary | ICD-10-CM | POA: Diagnosis not present

## 2022-04-27 DIAGNOSIS — M79674 Pain in right toe(s): Secondary | ICD-10-CM | POA: Diagnosis not present

## 2022-04-27 DIAGNOSIS — M2041 Other hammer toe(s) (acquired), right foot: Secondary | ICD-10-CM

## 2022-04-27 NOTE — Progress Notes (Signed)
Subjective:  Chief Complaint  Patient presents with   Foot Problem    diabetic foot care- nail trim and callus to bilateral feet- ball of feet   Peripheral Neuropathy    Patient stated he is interested in either diabetic shoes or qutenza treatment.     81 y.o. returns the office today for yearly diabetic foot exam as well as for painful, elongated, thickened toenails which he cannot trim himself and for calluses.  He still uses the metatarsasal pads which help. (he likes the gray ones).   BS- around 100 Last A1c- 5.7 on 5/31/203  Dr. Malachy Moan- PCP last seen 5/31  Objective: AAO 3, NAD DP/PT pulses palpable, CRT less than 3 seconds Sensation decreased with Semmes Weinstein monofilament. Nails hypertrophic, dystrophic, elongated, brittle, discolored 10. There is tenderness overlying the nails 1-5 bilaterally. There is no surrounding erythema or drainage along the nail sites. Hyperkeratotic lesions noted submetatarsal 4 on the left side there is some dried blood present underneath the callus but there is no skin breakdown.  Also hyperkeratotic lesion noted right submetatarsal 1 and 5.  There is no underlying ulceration. Prominence of metatarsal heads plantarly after fat pad.  MMT 5/5. No open lesions No pain with calf compression, swelling, warmth, erythema.  Assessment: Patient presents with symptomatic onychomycosis; hyperkeratotic lesions; neuropathy  Plan: -Treatment options including alternatives, risks, complications were discussed -will follow up for diabetic shoes  -Discussed quenza  but wants to hold off for now and try diabetic shoes. I will have him follow up for diabetic shoe measurement.  -Nails sharply debrided 10 without complication/bleeding. -Hyperkeratotic lesion sharply debrided x 4 without any complications or bleeding.  Continue moisturizer and offloading. -Gabapentin for neuropathy. -Discussed daily foot inspection. If there are any changes, to call the  office immediately.  -Follow-up in 3 months or sooner if any problems are to arise. In the meantime, encouraged to call the office with any questions, concerns, changes symptoms.  Celesta Gentile, DPM

## 2022-04-27 NOTE — Patient Instructions (Signed)
Diabetes Mellitus and Foot Care Foot care is an important part of your health, especially when you have diabetes. Diabetes may cause you to have problems because of poor blood flow (circulation) to your feet and legs, which can cause your skin to: Become thinner and drier. Break more easily. Heal more slowly. Peel and crack. You may also have nerve damage (neuropathy) in your legs and feet, causing decreased feeling in them. This means that you may not notice minor injuries to your feet that could lead to more serious problems. Noticing and addressing any potential problems early is the best way to prevent future foot problems. How to care for your feet Foot hygiene  Wash your feet daily with warm water and mild soap. Do not use hot water. Then, pat your feet and the areas between your toes until they are completely dry. Do not soak your feet as this can dry your skin. Trim your toenails straight across. Do not dig under them or around the cuticle. File the edges of your nails with an emery board or nail file. Apply a moisturizing lotion or petroleum jelly to the skin on your feet and to dry, brittle toenails. Use lotion that does not contain alcohol and is unscented. Do not apply lotion between your toes. Shoes and socks Wear clean socks or stockings every day. Make sure they are not too tight. Do not wear knee-high stockings since they may decrease blood flow to your legs. Wear shoes that fit properly and have enough cushioning. Always look in your shoes before you put them on to be sure there are no objects inside. To break in new shoes, wear them for just a few hours a day. This prevents injuries on your feet. Wounds, scrapes, corns, and calluses  Check your feet daily for blisters, cuts, bruises, sores, and redness. If you cannot see the bottom of your feet, use a mirror or ask someone for help. Do not cut corns or calluses or try to remove them with medicine. If you find a minor scrape,  cut, or break in the skin on your feet, keep it and the skin around it clean and dry. You may clean these areas with mild soap and water. Do not clean the area with peroxide, alcohol, or iodine. If you have a wound, scrape, corn, or callus on your foot, look at it several times a day to make sure it is healing and not infected. Check for: Redness, swelling, or pain. Fluid or blood. Warmth. Pus or a bad smell. General tips Do not cross your legs. This may decrease blood flow to your feet. Do not use heating pads or hot water bottles on your feet. They may burn your skin. If you have lost feeling in your feet or legs, you may not know this is happening until it is too late. Protect your feet from hot and cold by wearing shoes, such as at the beach or on hot pavement. Schedule a complete foot exam at least once a year (annually) or more often if you have foot problems. Report any cuts, sores, or bruises to your health care provider immediately. Where to find more information American Diabetes Association: www.diabetes.org Association of Diabetes Care & Education Specialists: www.diabeteseducator.org Contact a health care provider if: You have a medical condition that increases your risk of infection and you have any cuts, sores, or bruises on your feet. You have an injury that is not healing. You have redness on your legs or feet. You   feel burning or tingling in your legs or feet. You have pain or cramps in your legs and feet. Your legs or feet are numb. Your feet always feel cold. You have pain around any toenails. Get help right away if: You have a wound, scrape, corn, or callus on your foot and: You have pain, swelling, or redness that gets worse. You have fluid or blood coming from the wound, scrape, corn, or callus. Your wound, scrape, corn, or callus feels warm to the touch. You have pus or a bad smell coming from the wound, scrape, corn, or callus. You have a fever. You have a red  line going up your leg. Summary Check your feet every day for blisters, cuts, bruises, sores, and redness. Apply a moisturizing lotion or petroleum jelly to the skin on your feet and to dry, brittle toenails. Wear shoes that fit properly and have enough cushioning. If you have foot problems, report any cuts, sores, or bruises to your health care provider immediately. Schedule a complete foot exam at least once a year (annually) or more often if you have foot problems. This information is not intended to replace advice given to you by your health care provider. Make sure you discuss any questions you have with your health care provider. Document Revised: 04/17/2020 Document Reviewed: 04/17/2020 Elsevier Patient Education  2023 Elsevier Inc.  

## 2022-04-30 ENCOUNTER — Ambulatory Visit (INDEPENDENT_AMBULATORY_CARE_PROVIDER_SITE_OTHER): Payer: Medicare Other

## 2022-04-30 DIAGNOSIS — E1149 Type 2 diabetes mellitus with other diabetic neurological complication: Secondary | ICD-10-CM

## 2022-04-30 NOTE — Progress Notes (Signed)
Patient presents to the office today for diabetic shoe and insole measuring.  Patient was measured with brannock device to determine size and width for 1 pair of extra depth shoes and foam casted for 3 pair of insoles.   Documentation of medical necessity will be sent to patient's treating diabetic doctor to verify and sign.   Patient's diabetic provider: Malachy Moan, MD  Shoes and insoles will be ordered at that time and patient will be notified for an appointment for fitting when they arrive.   Shoe size (per patient): 12.5 wide men's   Brannock measurement: 12.5 men's wide  Patient shoe selection-   1st choice:   [id number] Men's  2nd choice:  [id number]   Shoe size ordered: 12.5 wide men's shoe

## 2022-05-19 DIAGNOSIS — G4733 Obstructive sleep apnea (adult) (pediatric): Secondary | ICD-10-CM | POA: Diagnosis not present

## 2022-05-20 DIAGNOSIS — I7121 Aneurysm of the ascending aorta, without rupture: Secondary | ICD-10-CM | POA: Diagnosis not present

## 2022-05-20 DIAGNOSIS — I700 Atherosclerosis of aorta: Secondary | ICD-10-CM | POA: Diagnosis not present

## 2022-05-28 DIAGNOSIS — H401131 Primary open-angle glaucoma, bilateral, mild stage: Secondary | ICD-10-CM | POA: Diagnosis not present

## 2022-06-09 ENCOUNTER — Encounter: Payer: Self-pay | Admitting: Podiatry

## 2022-06-11 ENCOUNTER — Other Ambulatory Visit: Payer: Self-pay | Admitting: Podiatry

## 2022-06-11 ENCOUNTER — Telehealth: Payer: Self-pay | Admitting: *Deleted

## 2022-06-11 MED ORDER — GABAPENTIN 100 MG PO CAPS: 100.0000 mg | ORAL_CAPSULE | Freq: Every day | ORAL | 0 refills | Status: DC

## 2022-06-11 NOTE — Telephone Encounter (Signed)
Patient is calling to request a refill of his Gabapentin-100 mg , thought it  was being sent to pharmacy, not there. Please advise.

## 2022-06-17 NOTE — Telephone Encounter (Signed)
Patient has picked up °

## 2022-07-22 DIAGNOSIS — G4733 Obstructive sleep apnea (adult) (pediatric): Secondary | ICD-10-CM | POA: Diagnosis not present

## 2022-07-29 ENCOUNTER — Ambulatory Visit (INDEPENDENT_AMBULATORY_CARE_PROVIDER_SITE_OTHER): Payer: Medicare Other | Admitting: Podiatry

## 2022-07-29 DIAGNOSIS — M79675 Pain in left toe(s): Secondary | ICD-10-CM

## 2022-07-29 DIAGNOSIS — E1149 Type 2 diabetes mellitus with other diabetic neurological complication: Secondary | ICD-10-CM | POA: Diagnosis not present

## 2022-07-29 DIAGNOSIS — L97521 Non-pressure chronic ulcer of other part of left foot limited to breakdown of skin: Secondary | ICD-10-CM | POA: Diagnosis not present

## 2022-07-29 DIAGNOSIS — B351 Tinea unguium: Secondary | ICD-10-CM

## 2022-07-29 DIAGNOSIS — M79674 Pain in right toe(s): Secondary | ICD-10-CM | POA: Diagnosis not present

## 2022-07-29 DIAGNOSIS — Q828 Other specified congenital malformations of skin: Secondary | ICD-10-CM | POA: Diagnosis not present

## 2022-07-29 MED ORDER — CICLOPIROX 8 % EX SOLN: Freq: Every day | CUTANEOUS | 2 refills | Status: AC

## 2022-07-29 MED ORDER — MUPIROCIN 2 % EX OINT: 1.0000 | TOPICAL_OINTMENT | Freq: Two times a day (BID) | CUTANEOUS | 2 refills | Status: AC

## 2022-07-29 NOTE — Patient Instructions (Signed)
Apply a small amount of antibiotic ointment (mupirocin) ointment and a bandage daily.  Monitor for any signs/symptoms of infection. Call the office immediately if any occur or go directly to the emergency room. Call with any questions/concerns.

## 2022-07-29 NOTE — Progress Notes (Signed)
Patient was remolded for diabetic insoles today. Details of shoe and size was documented on the 04/30/22 encounter.   I will order shoes and insoles today and patient will be notified once they arrive in office.

## 2022-07-29 NOTE — Progress Notes (Signed)
Subjective:  Chief Complaint  Patient presents with   Diabetes    Diabetic foot care, A1c- 7.5 [id number], Nail trim and callus on the left foot     81 y.o. returns the office today for thick, discolored toenails that he is unable to himself as well as for callus on the left foot.  He still has a small wound on the left foot.  Not sure how it started but it may have been from the shoe.  Still on gabapentin.  BS- around 100 Last A1c- 5.7 on 5/31/203  Dr. Malachy Moan- PCP last seen 5/31  Objective: AAO 3, NAD DP/PT pulses palpable, CRT less than 3 seconds Sensation decreased with Semmes Weinstein monofilament. Nails hypertrophic, dystrophic, elongated, brittle, discolored 10. There is tenderness overlying the nails 1-5 bilaterally. There is no surrounding erythema or drainage along the nail sites. Hyperkeratotic lesions noted submetatarsal 4 on the left side as well as right submetatarsal 1 and 5 without any underlying ulceration drainage or signs of infection. On the medial aspect of the left foot on the navicular tuberosity is a superficial granular wound measuring 0.4 x 0.4 x 0.1 cm without any probing to bone or tunneling.  Faint end of erythema with more formation.  No drainage or pus.  No temperature increase.  No fluctuation or crepitation.  No malodor. Prominence of metatarsal heads plantarly after fat pad.  MMT 5/5. No open lesions No pain with calf compression, swelling, warmth, erythema.     Assessment: Patient presents with symptomatic onychomycosis; hyperkeratotic lesions; neuropathy, ulceration left foot  Plan: -Treatment options including alternatives, risks, complications were discussed -Nails sharply debrided 10 without complication/bleeding. -Hyperkeratotic lesion sharply debrided x 3 without any complications or bleeding.   -Mupirocin ointment dressing changes of the wound to the left foot.  Monitor for any signs or symptoms of infection.  Offloading pads were  dispensed.  Continue moisturizer and offloading. -Gabapentin for neuropathy. -Discussed daily foot inspection. If there are any changes, to call the office immediately.   Follow-up for the wound in 3 weeks and routine care in 3 months.  Or sooner if needed.  Celesta Gentile, DPM

## 2022-08-19 ENCOUNTER — Ambulatory Visit: Payer: Medicare Other | Admitting: Podiatry

## 2022-08-23 ENCOUNTER — Telehealth: Payer: Self-pay | Admitting: Podiatry

## 2022-08-23 NOTE — Telephone Encounter (Signed)
Left message on vm to schedule appointment to pick up orthotics
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
## 2022-08-29 ENCOUNTER — Other Ambulatory Visit: Payer: Self-pay | Admitting: Podiatry

## 2022-08-31 ENCOUNTER — Ambulatory Visit (INDEPENDENT_AMBULATORY_CARE_PROVIDER_SITE_OTHER): Payer: Medicare Other

## 2022-08-31 DIAGNOSIS — E1149 Type 2 diabetes mellitus with other diabetic neurological complication: Secondary | ICD-10-CM

## 2022-08-31 DIAGNOSIS — L97521 Non-pressure chronic ulcer of other part of left foot limited to breakdown of skin: Secondary | ICD-10-CM | POA: Diagnosis not present

## 2022-08-31 DIAGNOSIS — Q828 Other specified congenital malformations of skin: Secondary | ICD-10-CM

## 2022-08-31 NOTE — Progress Notes (Signed)
Patient presents today to pick up diabetic shoes and insoles.  Patient was dispensed 1 pair of diabetic shoes and 3 pairs of foam casted diabetic insoles. Fit was satisfactory. Instructions for break-in and wear was reviewed and a copy was given to the patient.   Re-appointment for regularly scheduled diabetic foot care visits or if they should experience any trouble with the shoes or insoles.  

## 2022-10-12 DIAGNOSIS — I712 Thoracic aortic aneurysm, without rupture, unspecified: Secondary | ICD-10-CM | POA: Diagnosis not present

## 2022-10-12 DIAGNOSIS — I10 Essential (primary) hypertension: Secondary | ICD-10-CM | POA: Diagnosis not present

## 2022-10-12 DIAGNOSIS — G629 Polyneuropathy, unspecified: Secondary | ICD-10-CM | POA: Diagnosis not present

## 2022-10-12 DIAGNOSIS — Z0000 Encounter for general adult medical examination without abnormal findings: Secondary | ICD-10-CM | POA: Diagnosis not present

## 2022-10-12 DIAGNOSIS — E1140 Type 2 diabetes mellitus with diabetic neuropathy, unspecified: Secondary | ICD-10-CM | POA: Diagnosis not present

## 2022-10-12 DIAGNOSIS — E7800 Pure hypercholesterolemia, unspecified: Secondary | ICD-10-CM | POA: Diagnosis not present

## 2022-10-12 DIAGNOSIS — K219 Gastro-esophageal reflux disease without esophagitis: Secondary | ICD-10-CM | POA: Diagnosis not present

## 2022-10-12 DIAGNOSIS — M62838 Other muscle spasm: Secondary | ICD-10-CM | POA: Diagnosis not present

## 2022-10-12 DIAGNOSIS — E1142 Type 2 diabetes mellitus with diabetic polyneuropathy: Secondary | ICD-10-CM | POA: Diagnosis not present

## 2022-10-13 DIAGNOSIS — Z1159 Encounter for screening for other viral diseases: Secondary | ICD-10-CM | POA: Diagnosis not present

## 2022-10-28 DIAGNOSIS — G4733 Obstructive sleep apnea (adult) (pediatric): Secondary | ICD-10-CM | POA: Diagnosis not present

## 2022-11-02 ENCOUNTER — Ambulatory Visit: Payer: Medicare Other | Admitting: Podiatry

## 2022-11-02 DIAGNOSIS — Q828 Other specified congenital malformations of skin: Secondary | ICD-10-CM

## 2022-11-02 DIAGNOSIS — M79674 Pain in right toe(s): Secondary | ICD-10-CM

## 2022-11-02 DIAGNOSIS — B351 Tinea unguium: Secondary | ICD-10-CM | POA: Diagnosis not present

## 2022-11-02 DIAGNOSIS — M79675 Pain in left toe(s): Secondary | ICD-10-CM | POA: Diagnosis not present

## 2022-11-02 DIAGNOSIS — E1149 Type 2 diabetes mellitus with other diabetic neurological complication: Secondary | ICD-10-CM | POA: Diagnosis not present

## 2022-11-02 NOTE — Progress Notes (Signed)
Subjective: Chief Complaint  Patient presents with   diabetic foot care     [facility], neuropathy     82 y.o. returns the office today for thick, discolored toenails that he is unable to himself as well as for callus on the left foot.  The new diabetic shoes feel great. The wound has healed that he had. He is still the topical nail fungus medication. His PCP increased gabapention to 2 a day. This has helped.   Last A1c- he is unsure   Caren Macadam, MD- last seen 10/29/2022   Objective: AAO 3, NAD DP/PT pulses palpable, CRT less than 3 seconds Sensation decreased with Semmes Weinstein monofilament. Nails hypertrophic, dystrophic, elongated, brittle, discolored 10. There is tenderness overlying the nails 1-5 bilaterally. There is no surrounding erythema or drainage along the nail sites. Hyperkeratotic lesions noted submetatarsal 4 on the left side as well as right submetatarsal 1 and 5 without any underlying ulceration drainage or signs of infection. Prominence of metatarsal heads plantarly after fat pad.  MMT 5/5. No open lesions No pain with calf compression, swelling, warmth, erythema.   Assessment: Patient presents with symptomatic onychomycosis; hyperkeratotic lesions; neuropathy  Plan: -Treatment options including alternatives, risks, complications were discussed -Nails sharply debrided 10 without complication/bleeding. -Hyperkeratotic lesion sharply debrided x 3 without any complications or bleeding.   -Gabapentin for neuropathy. -Discussed daily foot inspection. If there are any changes, to call the office immediately.   RTC 3 months or sooner if needed  Celesta Gentile, DPM

## 2023-01-16 IMAGING — CT CT CHEST W/O CM
2 of 5 series · 14 of 36 positions shown, 17 images · non-contrast
Comparison: 05/07/2020

CLINICAL DATA: Follow-up thoracic aortic aneurysm



[Series 4: chest 2.00 br40 s3 · coronal · 0.58mm/px · 3 of 161 slices shown]
[im 33/161  lung]
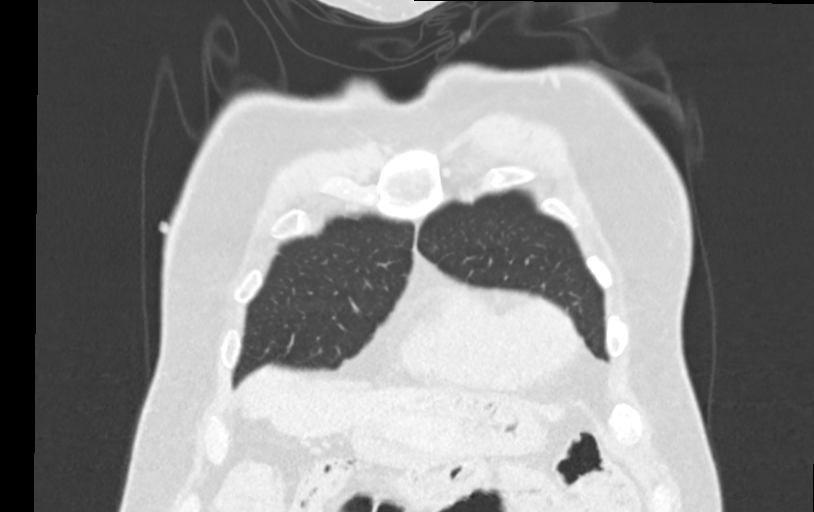
[im 65/161  lung]
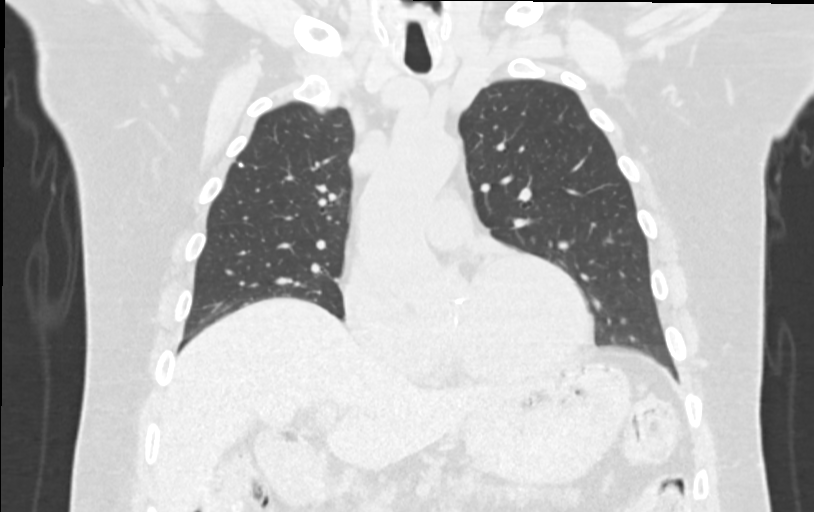
[im 97/161  lung]
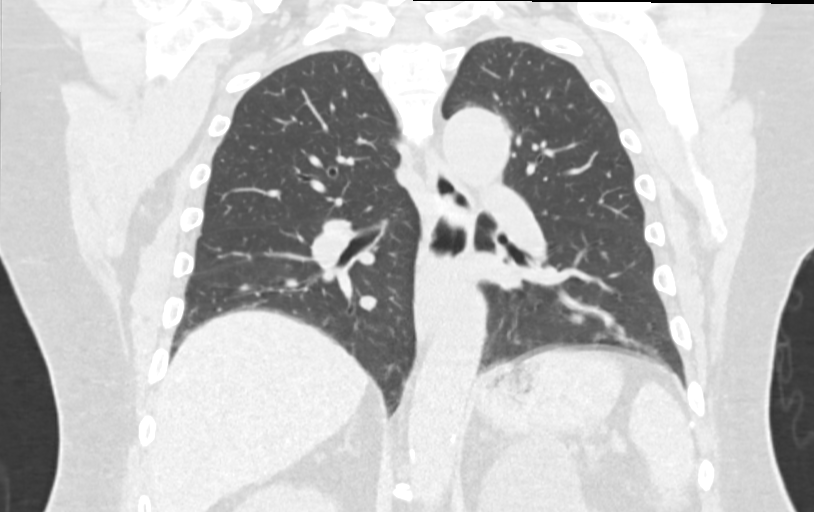

[Series 10: chest 1.00 br40 s3 super d · axial · 0.79mm/px · z∈[+1470,+1731]mm · 11 of 378 slices shown, 14 images]
[im 26/378  mediastinal]
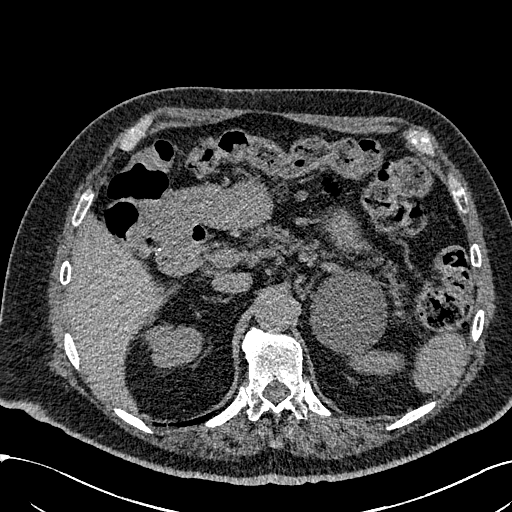
[im 26/378  lung]
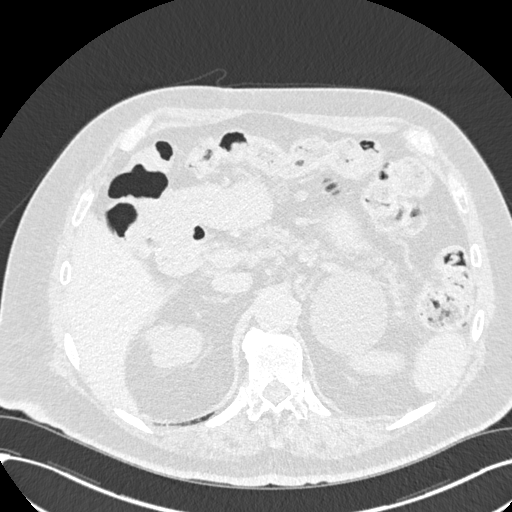
[im 51/378  lung]
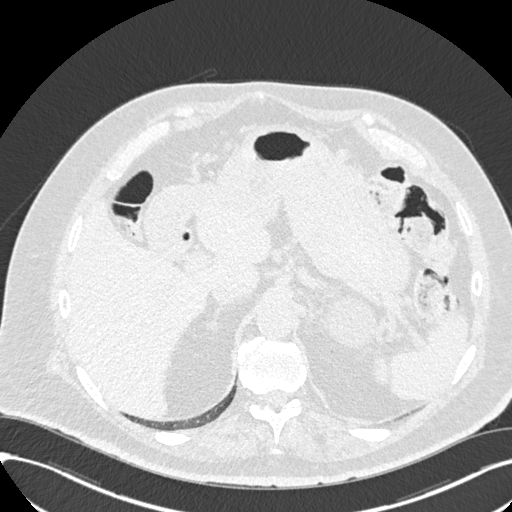
[im 101/378  lung]
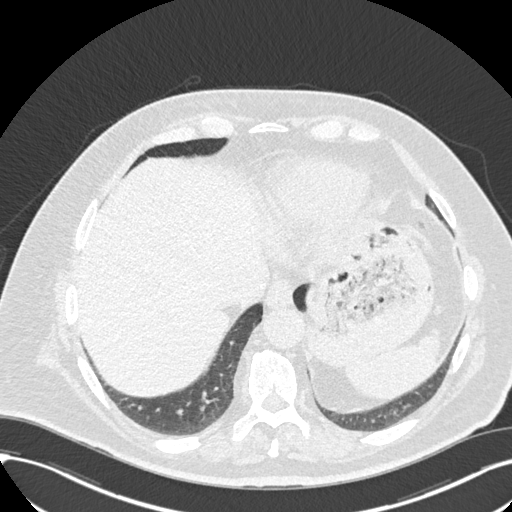
[im 126/378  lung]
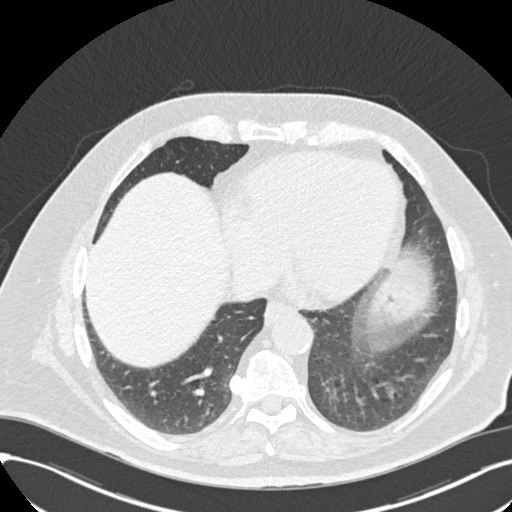
[im 151/378  mediastinal]
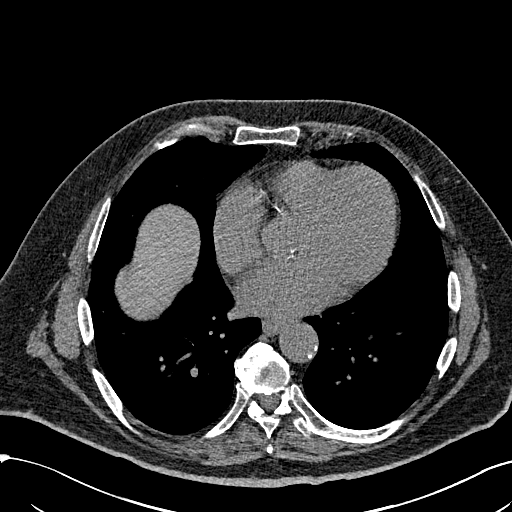
[im 151/378  lung]
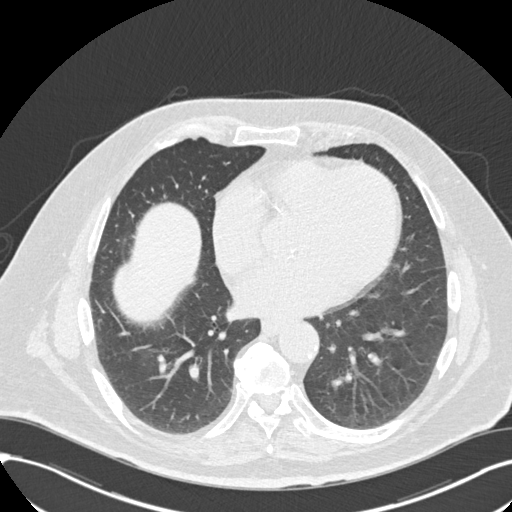
[im 202/378  lung]
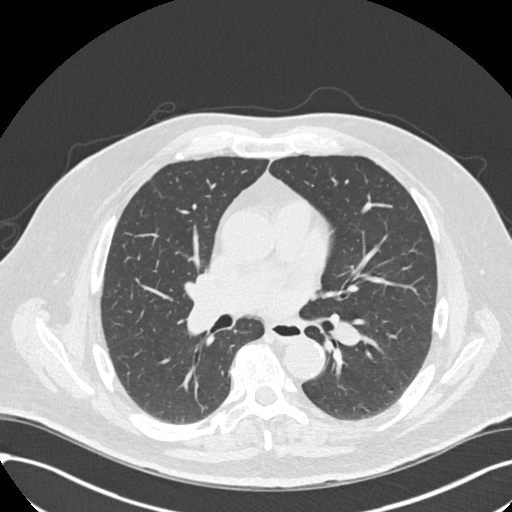
[im 227/378  lung]
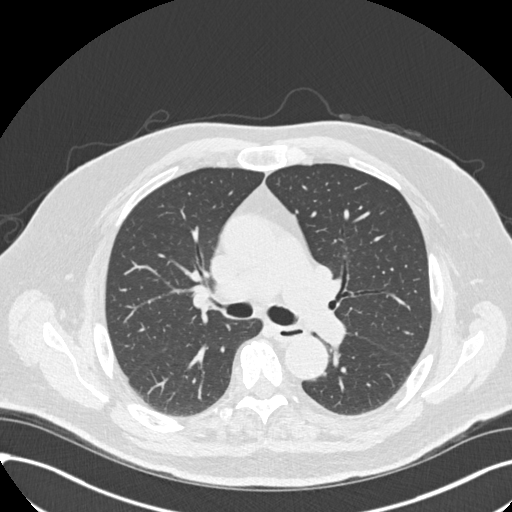
[im 252/378  lung]
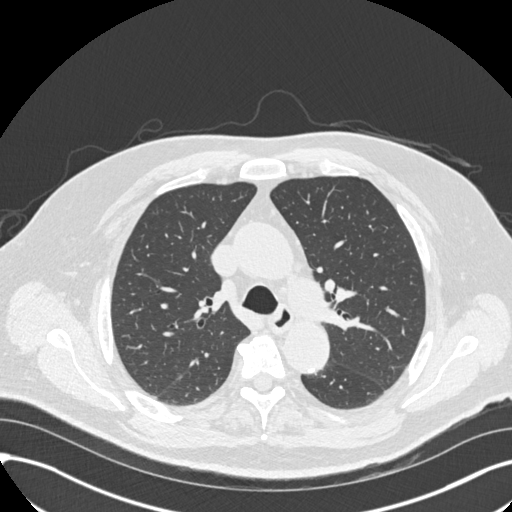
[im 277/378  mediastinal]
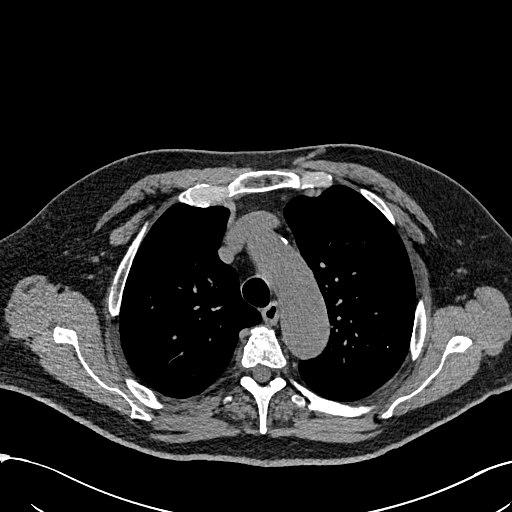
[im 277/378  lung]
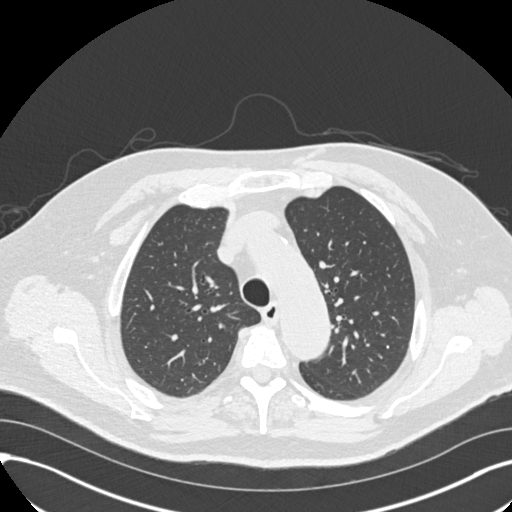
[im 327/378  lung]
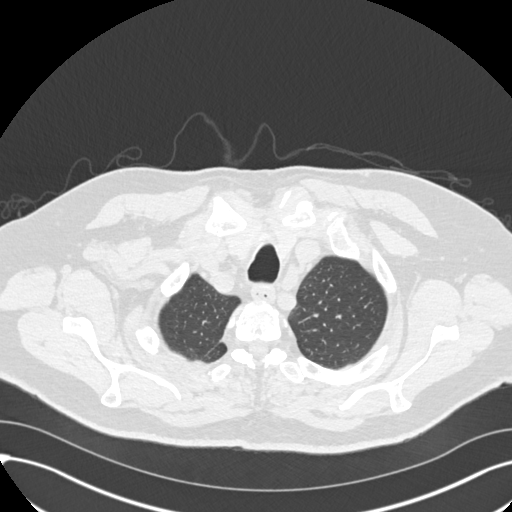
[im 352/378  lung]
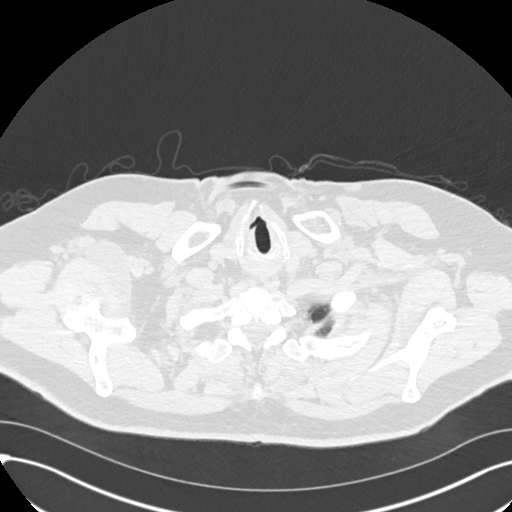

[14 of 36 positions shown; findings below may reference images not displayed]

FINDINGS: Cardiovascular: Normal heart size. No pericardial effusion. Thoracic
aortic atherosclerosis. Ascending thoracic aortic aneurysm measuring
4.2 cm. Coronary artery atherosclerosis. Dilatation of the right
main pulmonary artery.

Mediastinum/Nodes: No enlarged mediastinal or axillary lymph nodes.
Thyroid gland, trachea, and esophagus demonstrate no significant
findings.

Lungs/Pleura: Lungs are clear. No pleural effusion or pneumothorax.
Small calcified right upper lobe pulmonary nodule likely reflecting
sequela prior granulomatous disease.

Upper Abdomen: No acute abnormality. Stable left renal cyst. No
follow-up imaging recommended.

Musculoskeletal: No chest wall mass or suspicious bone lesions
identified. Anterior bridging osteophytes of the lower thoracic and
upper lumbar spine as can be seen with diffuse idiopathic skeletal
hyperostosis.
IMPRESSION: 1. Ascending thoracic aortic aneurysm measuring 4.2 cm. Recommend
annual imaging followup by CTA or MRA. This recommendation follows
2292 ACCF/AHA/AATS/ACR/ASA/SCA/KNOLL/TOPSKY/BASDEO/PETRICHER Guidelines for the
Diagnosis and Management of Patients with Thoracic Aortic Disease.
Circulation. 2292; 121: E266-e369. Aortic aneurysm NOS (SYI7Z-JLA.3)
2. Dilatation of the right main pulmonary artery, which can be seen
in setting of pulmonary arterial hypertension.
3. Coronary artery atherosclerosis.
4. Aortic Atherosclerosis (SYI7Z-KWA.A).

## 2023-02-01 ENCOUNTER — Ambulatory Visit: Payer: Medicare Other | Admitting: Podiatry

## 2023-03-03 ENCOUNTER — Telehealth: Payer: Self-pay | Admitting: Podiatry

## 2023-03-03 NOTE — Telephone Encounter (Signed)
Pt left message yesterday at 950pm stating he wanted to cancel his appt for tomorrow at 815. His wife has broken her hip in several places and he is spending time with her.  I returned call and left message that we have pt scheduled for 5.31.2024 next Friday at 915 and to call if he would like to cxl that appt.

## 2023-03-11 ENCOUNTER — Ambulatory Visit: Payer: Medicare Other | Admitting: Podiatry

## 2023-03-25 DIAGNOSIS — G4733 Obstructive sleep apnea (adult) (pediatric): Secondary | ICD-10-CM | POA: Diagnosis not present

## 2023-04-12 DIAGNOSIS — G629 Polyneuropathy, unspecified: Secondary | ICD-10-CM | POA: Diagnosis not present

## 2023-04-12 DIAGNOSIS — E1142 Type 2 diabetes mellitus with diabetic polyneuropathy: Secondary | ICD-10-CM | POA: Diagnosis not present

## 2023-04-12 DIAGNOSIS — H8110 Benign paroxysmal vertigo, unspecified ear: Secondary | ICD-10-CM | POA: Diagnosis not present

## 2023-04-12 DIAGNOSIS — I7123 Aneurysm of the descending thoracic aorta, without rupture: Secondary | ICD-10-CM | POA: Diagnosis not present

## 2023-04-12 DIAGNOSIS — I10 Essential (primary) hypertension: Secondary | ICD-10-CM | POA: Diagnosis not present

## 2023-04-12 DIAGNOSIS — E7800 Pure hypercholesterolemia, unspecified: Secondary | ICD-10-CM | POA: Diagnosis not present

## 2023-04-12 DIAGNOSIS — I700 Atherosclerosis of aorta: Secondary | ICD-10-CM | POA: Diagnosis not present

## 2023-05-23 ENCOUNTER — Other Ambulatory Visit: Payer: Self-pay | Admitting: Cardiothoracic Surgery

## 2023-05-23 DIAGNOSIS — I7121 Aneurysm of the ascending aorta, without rupture: Secondary | ICD-10-CM

## 2023-06-27 ENCOUNTER — Encounter: Payer: Self-pay | Admitting: Cardiothoracic Surgery

## 2023-06-27 ENCOUNTER — Ambulatory Visit: Payer: Medicare Other | Admitting: Cardiothoracic Surgery

## 2023-06-27 ENCOUNTER — Ambulatory Visit
Admission: RE | Admit: 2023-06-27 | Discharge: 2023-06-27 | Disposition: A | Discharge status: Home / Self Care | Payer: Medicare Other | Source: Ambulatory Visit | Attending: Cardiothoracic Surgery | Admitting: Cardiothoracic Surgery

## 2023-06-27 VITALS — BP 125/75 | HR 60 | Resp 18 | Ht 71.0 in | Wt 256.0 lb

## 2023-06-27 DIAGNOSIS — I719 Aortic aneurysm of unspecified site, without rupture: Secondary | ICD-10-CM | POA: Insufficient documentation

## 2023-06-27 DIAGNOSIS — I7121 Aneurysm of the ascending aorta, without rupture: Secondary | ICD-10-CM

## 2023-06-27 NOTE — Progress Notes (Signed)
HPI: Patient returns for a scheduled office visit with noncontrasted CT scan of the chest to follow an asymptomatic stable ascending aneurysm.  He was last evaluated 18 months ago.  He has remained asymptomatic.  The aortic diameter on his recent CT scan which I personally reviewed is measured at 4.2 cm.  He has a mild-moderate ascending aneurysm with very low risk for aortic care/dissection and the best treatment is continued blood pressure control and surveillance scans.   Since last visit the patient is not been hospitalized or had any new medical issues.  He has been caring for his wife who fell and broke her hip and needed surgery.  He states he is still been following his heart healthy diet and taking his blood pressure medication as prescribed.  He denies any chest pains.  He states he is probably gained a little bit of weight.  Current Outpatient Medications  Medication Sig Dispense Refill   ACCU-CHEK GUIDE test strip USE AS DIRECTED ONCE A DAY     ALPRAZolam (XANAX) 0.5 MG tablet TAKE 1 TABLET BY MOUTH AT BEDTIME AS NEEDED FOR ANXIETY 90 tablet 2   amLODipine (NORVASC) 10 MG tablet Take 10 mg by mouth daily.     amLODipine (NORVASC) 10 MG tablet Take 1 tablet by mouth daily.     amLODipine (NORVASC) 10 MG tablet Take by mouth.     aspirin EC 81 MG tablet Take 81 mg by mouth every evening.     atorvastatin (LIPITOR) 20 MG tablet Take 1 tablet (20 mg total) by mouth daily at 6 PM. 90 tablet 3   atorvastatin (LIPITOR) 40 MG tablet Take 1 tablet by mouth daily.     B Complex, Folic Acid, TABS Take 1 tablet by mouth daily.     betamethasone dipropionate 0.05 % cream 1 application     Calcium Carbonate-Vitamin D (CALCIUM 600 + D PO) Take 1 tablet by mouth daily.     Calcium Carbonate-Vitamin D 600-200 MG-UNIT CAPS Take 1 tablet by mouth daily.     cholecalciferol (VITAMIN D) 1000 UNITS tablet Take 1,000 Units by mouth daily.     ciclopirox (PENLAC) 8 % solution Apply topically at bedtime.  Apply over nail and surrounding skin. Apply daily over previous coat. After seven (7) days, may remove with alcohol and continue cycle. 6.6 mL 2   diclofenac (VOLTAREN) 50 MG EC tablet Take 50 mg by mouth 2 (two) times daily.     diphenhydrAMINE (BENADRYL ALLERGY) 25 MG tablet 1 tablet     diphenhydrAMINE (SOMINEX) 25 MG tablet 1 tablet     dorzolamide (TRUSOPT) 2 % ophthalmic solution 1 drop 2 (two) times daily.     dorzolamide-timolol (COSOPT) 22.3-6.8 MG/ML ophthalmic solution INSTILL 1 DROP IN OU Q 12 H  3   finasteride (PROSCAR) 5 MG tablet TAKE 1 TABLET BY MOUTH DAILY 90 tablet 1   gabapentin (NEURONTIN) 100 MG capsule Take by mouth.     gabapentin (NEURONTIN) 100 MG capsule Take 1 capsule (100 mg total) by mouth at bedtime. (Patient taking differently: Take 100 mg by mouth 2 (two) times daily.) 90 capsule 0   Garlic 1000 MG CAPS Take 1,000 mg by mouth daily.     HYDROcodone-acetaminophen (NORCO) 7.5-325 MG tablet Take by mouth.     lisinopril (ZESTRIL) 2.5 MG tablet Take 2.5 mg by mouth daily.     meclizine (ANTIVERT) 25 MG tablet Take 1 tablet (25 mg total) by mouth 3 (three) times daily as  needed for dizziness. 75 tablet 1   methocarbamol (ROBAXIN) 750 MG tablet Take 750 mg by mouth every 6 (six) hours as needed.     mupirocin ointment (BACTROBAN) 2 % Apply 1 Application topically 2 (two) times daily. 30 g 2   omega-3 acid ethyl esters (LOVAZA) 1 g capsule Take 1 capsule by mouth daily.     Omega-3 Fatty Acids (FISH OIL) 1000 MG CAPS Take 1,000 mg by mouth daily.     omeprazole (PRILOSEC) 20 MG capsule Take 20 mg by mouth daily.     silodosin (RAPAFLO) 8 MG CAPS capsule Take 1 capsule (8 mg total) by mouth daily with breakfast. 30 capsule 11   tamsulosin (FLOMAX) 0.4 MG CAPS capsule Take 0.4 mg by mouth daily.     tiZANidine (ZANAFLEX) 4 MG tablet 1/2 tablet as needed     tiZANidine (ZANAFLEX) 4 MG tablet Take by mouth.     triamcinolone lotion (KENALOG) 0.1 % 1 application      venlafaxine XR (EFFEXOR-XR) 150 MG 24 hr capsule TAKE ONE CAPSULE BY MOUTH EVERY DAY 90 capsule 0   vitamin C (ASCORBIC ACID) 500 MG tablet Take 500 mg by mouth daily.     Vitamins/Minerals TABS Take 1 tablet by mouth daily.     Zinc 50 MG TABS Take 50 mg by mouth daily.     No current facility-administered medications for this visit.     Physical Exam:Blood pressure 125/75, pulse 60, resp. rate 18, height 5\' 11"  (1.803 m), weight 256 lb (116.1 kg), SpO2 97%.        Exam    General- alert and comfortable    Neck- no JVD, no cervical adenopathy palpable, no carotid bruit   Lungs- clear without rales, wheezes   Cor- regular rate and rhythm, no murmur , gallop   Abdomen- soft, non-tender   Extremities - warm, non-tender, minimal edema   Neuro- oriented, appropriate, no focal weakness  Diagnostic Tests: CT scan of the chest is personally reviewed.  The aorta remains mildly dilated with a diameter of 4.2 cm.  Lungs are clear and there is no abnormal mediastinal adenopathy.  Impression: Stable asymptomatic fusiform ascending aneurysm. He does not meet criteria for surgery. Best treatment going forward is continued blood pressure control to keep systolic blood pressure less than 140 systolic with surveillance scanning.  Plan: The patient will return for annual CT scan of the chest without contrast to monitor the ascending thoracic aorta.   Lovett Sox, MD [facility] [phone]

## 2023-07-08 DIAGNOSIS — Z961 Presence of intraocular lens: Secondary | ICD-10-CM | POA: Diagnosis not present

## 2023-07-08 DIAGNOSIS — E119 Type 2 diabetes mellitus without complications: Secondary | ICD-10-CM | POA: Diagnosis not present

## 2023-07-08 DIAGNOSIS — H401131 Primary open-angle glaucoma, bilateral, mild stage: Secondary | ICD-10-CM | POA: Diagnosis not present

## 2023-07-26 ENCOUNTER — Telehealth: Payer: Self-pay | Admitting: Neurology

## 2023-07-26 ENCOUNTER — Ambulatory Visit: Payer: Medicare Other | Admitting: Neurology

## 2023-07-26 ENCOUNTER — Encounter: Payer: Self-pay | Admitting: Neurology

## 2023-07-26 VITALS — BP 147/72 | HR 57 | Ht 68.0 in | Wt 257.0 lb

## 2023-07-26 DIAGNOSIS — R202 Paresthesia of skin: Secondary | ICD-10-CM | POA: Insufficient documentation

## 2023-07-26 DIAGNOSIS — R269 Unspecified abnormalities of gait and mobility: Secondary | ICD-10-CM | POA: Insufficient documentation

## 2023-07-26 NOTE — Telephone Encounter (Signed)
[employer] medicare NPR sent to GI [phone] 

## 2023-07-26 NOTE — Progress Notes (Signed)
Chief Complaint  Patient presents with   New Patient (Initial Visit)    Rm 15. Alone. NP paper proficient referral for LE Paresthesias. He reports LE paresthesia for the past two and a half years. Patient has diabetes.      ASSESSMENT AND PLAN  [name] is a 82 y.o. male   Slow worsening gait abnormalities, urinary urgency Ascending paresthesia History of lumbar decompression surgery  Length-dependent sensory injuries, distal leg and intrinsic hand muscle weakness, positive Romberg signs, decreased reflex  Differentiation diagnosis include peripheral neuropathy with superimposed lumbosacral radiculopathy, also cervical radiculopathy  EMG nerve conduction study  MRI cervical and lumbar spine  DIAGNOSTIC DATA (LABS, IMAGING, TESTING) - I reviewed patient records, labs, notes, testing and imaging myself where available.   MEDICAL HISTORY:  [name] is a 82 year old male,, seen in request by his primary care from [facility] Dr. Tracie Harrier, [place], for evaluation of slow worsening gait abnormality, lower extremity paresthesia, initial evaluation July 26, 2023  History is obtained from the patient and review of electronic medical records. I personally reviewed pertinent available imaging films in PACS.   PMHx of  HTN HLD Depression, anxiety Sleep apnea, -CPAP BPH Lumbar decompression  Since 2019, he noticed bilateral feet paresthesia, fairly symmetric, mainly involving plantar surface, gradually getting worse, now complains of stinging pain, getting worse after weightbearing, he have to change his shoes frequently, since 2023, he noticed gradual worsening gait abnormality, uses cane sometimes, also noticed bilateral fingertips numbness tingling, he also complains of worsening urinary urgency, had a history of lumbar decompression surgery many years ago  He is the main caregiver of his wife, who suffered Parkinson's disease, pelvic fracture, just recently improving from  wheelchair to walker,  Lab from [facility] In July 2024 B12, folic acid more than 20, LDL 69, normal CMP creatinine of 0.78, A1c 6.3, normal TSH 2.4,    PHYSICAL EXAM:   Vitals:   07/26/23 1355  BP: (!) 147/72  Pulse: (!) 57  Weight: 257 lb (116.6 kg)  Height: 5\' 8"  (1.727 m)      Body mass index is 39.08 kg/m.  PHYSICAL EXAMNIATION:  Gen: NAD, conversant, well nourised, well groomed                     Cardiovascular: Regular rate rhythm, no peripheral edema, warm, nontender. Eyes: Conjunctivae clear without exudates or hemorrhage Neck: Supple, no carotid bruits.  Limitation range of motion of neck turning Pulmonary: Clear to auscultation bilaterally   NEUROLOGICAL EXAM:  MENTAL STATUS: Speech/cognition: Awake, alert, oriented to history taking and casual conversation CRANIAL NERVES: CN II: Visual fields are full to confrontation. Pupils are round equal and briskly reactive to light. CN III, IV, VI: extraocular movement are normal. No ptosis. CN V: Facial sensation is intact to light touch CN VII: Face is symmetric with normal eye closure  CN VIII: Hearing is normal to causal conversation. CN IX, X: Phonation is normal. CN XI: Head turning and shoulder shrug are intact  MOTOR: Mild bilateral hand intrinsic muscle atrophy, no upper extremity proximal muscle weakness, mild bilateral finger abduction weakness.  No significant proximal lower extremity muscle weakness, moderate bilateral toe flexion extension weakness  REFLEXES: Reflexes are absent.  SENSORY: Discoloration of distal lower extremity   Coordination: There is no trunk or limb dysmetria noted.  GAIT/STANCE: Push-up to get up from seated position, wide-based, unsteady, could not stand up on tiptoe and heels, positive Romberg signs  REVIEW OF  SYSTEMS:  Full 14 system review of systems performed and notable only for as above All other review of systems were negative.   ALLERGIES: Allergies  Allergen  Reactions   Quinapril Hcl     cough   Quinapril Hcl Other (See Comments)    Cough; "Threw me for a loop"    HOME MEDICATIONS: Current Outpatient Medications  Medication Sig Dispense Refill   ACCU-CHEK GUIDE test strip USE AS DIRECTED ONCE A DAY     amLODipine (NORVASC) 10 MG tablet Take 10 mg by mouth daily.     amLODipine (NORVASC) 10 MG tablet Take by mouth.     aspirin EC 81 MG tablet Take 81 mg by mouth every evening.     atorvastatin (LIPITOR) 40 MG tablet Take 1 tablet by mouth daily.     B Complex, Folic Acid, TABS Take 1 tablet by mouth daily.     betamethasone dipropionate 0.05 % cream 1 application     Calcium Carbonate-Vitamin D (CALCIUM 600 + D PO) Take 1 tablet by mouth daily.     Calcium Carbonate-Vitamin D 600-200 MG-UNIT CAPS Take 1 tablet by mouth daily.     cholecalciferol (VITAMIN D) 1000 UNITS tablet Take 1,000 Units by mouth daily.     ciclopirox (PENLAC) 8 % solution Apply topically at bedtime. Apply over nail and surrounding skin. Apply daily over previous coat. After seven (7) days, may remove with alcohol and continue cycle. 6.6 mL 2   dorzolamide (TRUSOPT) 2 % ophthalmic solution 1 drop 2 (two) times daily.     dorzolamide-timolol (COSOPT) 22.3-6.8 MG/ML ophthalmic solution INSTILL 1 DROP IN OU Q 12 H  3   gabapentin (NEURONTIN) 100 MG capsule Take 1 capsule (100 mg total) by mouth at bedtime. (Patient taking differently: Take 100 mg by mouth 2 (two) times daily.) 90 capsule 0   Garlic 1000 MG CAPS Take 1,000 mg by mouth daily.     lisinopril (ZESTRIL) 2.5 MG tablet Take 2.5 mg by mouth daily.     meclizine (ANTIVERT) 25 MG tablet Take 1 tablet (25 mg total) by mouth 3 (three) times daily as needed for dizziness. 75 tablet 1   mupirocin ointment (BACTROBAN) 2 % Apply 1 Application topically 2 (two) times daily. 30 g 2   omeprazole (PRILOSEC) 20 MG capsule Take 20 mg by mouth daily.     silodosin (RAPAFLO) 8 MG CAPS capsule Take 1 capsule (8 mg total) by mouth  daily with breakfast. 30 capsule 11   tiZANidine (ZANAFLEX) 4 MG tablet 1/2 tablet as needed     triamcinolone lotion (KENALOG) 0.1 % 1 application     venlafaxine XR (EFFEXOR-XR) 150 MG 24 hr capsule TAKE ONE CAPSULE BY MOUTH EVERY DAY 90 capsule 0   Vitamins/Minerals TABS Take 1 tablet by mouth daily.     Zinc 50 MG TABS Take 50 mg by mouth daily.     No current facility-administered medications for this visit.    PAST MEDICAL HISTORY: Past Medical History:  Diagnosis Date   Anxiety    Arthritis    BPH (benign prostatic hyperplasia)    Depression    GERD (gastroesophageal reflux disease)    Hemorrhoid    Hyperlipidemia    Hypertension    Sleep apnea    wears CPAP    PAST SURGICAL HISTORY: Past Surgical History:  Procedure Laterality Date   BACK SURGERY     cataract with IOL OD     Cataract with IOL OS-  complications requiring  laser therapy and drops     CHOLECYSTECTOMY     NASAL SINUS SURGERY     PARS PLANA VITRECTOMY Left 12/26/2012   Procedure: PARS PLANA VITRECTOMY WITH 25G REMOVAL/SUTURE INTRAOCULAR LENS;  Surgeon: Sherrie George, MD;  Location: [facility] OR;  Service: Ophthalmology;  Laterality: Left;   SEPTOPLASTY     teeth extracted     '08   TONSILLECTOMY     VITERECTOMY Left 12/26/2012   Dr Ashley Royalty    FAMILY HISTORY: Family History  Problem Relation Age of Onset   Hyperlipidemia Mother    Hypertension Mother    Cancer Father        lung cancer    SOCIAL HISTORY: Social History   Socioeconomic History   Marital status: Married    Spouse name: brenda   Number of children: 3   Years of education: 18   Highest education level: Not on file  Occupational History   Occupation: Copywriter, advertising: [employer]  Tobacco Use   Smoking status: Former    Types: Cigarettes   Smokeless tobacco: Never   Tobacco comments:    quit 15 years ago  Substance and Sexual Activity   Alcohol use: No   Drug use: No   Sexual activity: Not Currently   Other Topics Concern   Not on file  Social History Narrative   [employer]-BA, [employer]- MD[employer]. Married 1966. 3 boys, - '68, '73, '74   3 grandchildren. 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 SO- medical problems.ACP -End of life: no cpr, no prolonged Ventilator support, would consider HD, no heroic life- sustaining therapy, e.g. Artificial feeding or hydration in the long term.    Retired in August 2014.   Social Determinants of Health   Financial Resource Strain: Not on file  Food Insecurity: Not on file  Transportation Needs: Not on file  Physical Activity: Not on file  Stress: Not on file  Social Connections: Not on file  Intimate Partner Violence: Not on file      Levert Feinstein, M.D. Ph.D.  [facility] [address], [address] [place], [place] [address] Ph: [phone] Fax: [phone]  CC:  Aliene Beams, MD [phone] [address] [place],  [place] [address]  Aliene Beams, MD

## 2023-07-26 NOTE — Addendum Note (Signed)
Addended by: Levert Feinstein on: 07/26/2023 02:49 PM   Modules accepted: Level of Service

## 2023-07-28 ENCOUNTER — Telehealth: Payer: Self-pay | Admitting: Neurology

## 2023-07-28 NOTE — Telephone Encounter (Signed)
[facility] is going to take this patient starting next week, that is the first available I could get with his insurance.

## 2023-08-01 DIAGNOSIS — R202 Paresthesia of skin: Secondary | ICD-10-CM | POA: Diagnosis not present

## 2023-08-01 DIAGNOSIS — I10 Essential (primary) hypertension: Secondary | ICD-10-CM | POA: Diagnosis not present

## 2023-08-01 DIAGNOSIS — R269 Unspecified abnormalities of gait and mobility: Secondary | ICD-10-CM | POA: Diagnosis not present

## 2023-08-01 DIAGNOSIS — Z87891 Personal history of nicotine dependence: Secondary | ICD-10-CM | POA: Diagnosis not present

## 2023-08-02 ENCOUNTER — Telehealth: Payer: Self-pay | Admitting: Neurology

## 2023-08-02 NOTE — Telephone Encounter (Signed)
Liji from [facility] called requesting VO for Home PT 2x 3w and 1x 3w She would also like to add a nurse to check on a sm wound that the pt has on his Lower R Leg to make sure that it is not deeper than what it looks.

## 2023-08-02 NOTE — Telephone Encounter (Signed)
Ok to add on home health service suggested

## 2023-08-02 NOTE — Telephone Encounter (Signed)
[facility] ended up taking him.

## 2023-08-03 NOTE — Telephone Encounter (Signed)
Call to [facility] and gave verbal orders per Dr. Terrace Arabia as requested.

## 2023-08-05 DIAGNOSIS — R202 Paresthesia of skin: Secondary | ICD-10-CM | POA: Diagnosis not present

## 2023-08-05 DIAGNOSIS — R269 Unspecified abnormalities of gait and mobility: Secondary | ICD-10-CM | POA: Diagnosis not present

## 2023-08-05 DIAGNOSIS — I10 Essential (primary) hypertension: Secondary | ICD-10-CM | POA: Diagnosis not present

## 2023-08-05 DIAGNOSIS — Z87891 Personal history of nicotine dependence: Secondary | ICD-10-CM | POA: Diagnosis not present

## 2023-08-08 DIAGNOSIS — R269 Unspecified abnormalities of gait and mobility: Secondary | ICD-10-CM | POA: Diagnosis not present

## 2023-08-08 DIAGNOSIS — R202 Paresthesia of skin: Secondary | ICD-10-CM | POA: Diagnosis not present

## 2023-08-08 DIAGNOSIS — Z87891 Personal history of nicotine dependence: Secondary | ICD-10-CM | POA: Diagnosis not present

## 2023-08-08 DIAGNOSIS — I10 Essential (primary) hypertension: Secondary | ICD-10-CM | POA: Diagnosis not present

## 2023-08-10 DIAGNOSIS — Z87891 Personal history of nicotine dependence: Secondary | ICD-10-CM | POA: Diagnosis not present

## 2023-08-10 DIAGNOSIS — I10 Essential (primary) hypertension: Secondary | ICD-10-CM | POA: Diagnosis not present

## 2023-08-10 DIAGNOSIS — R202 Paresthesia of skin: Secondary | ICD-10-CM | POA: Diagnosis not present

## 2023-08-10 DIAGNOSIS — R269 Unspecified abnormalities of gait and mobility: Secondary | ICD-10-CM | POA: Diagnosis not present

## 2023-08-11 DIAGNOSIS — I10 Essential (primary) hypertension: Secondary | ICD-10-CM | POA: Diagnosis not present

## 2023-08-11 DIAGNOSIS — Z87891 Personal history of nicotine dependence: Secondary | ICD-10-CM | POA: Diagnosis not present

## 2023-08-11 DIAGNOSIS — R269 Unspecified abnormalities of gait and mobility: Secondary | ICD-10-CM | POA: Diagnosis not present

## 2023-08-11 DIAGNOSIS — R202 Paresthesia of skin: Secondary | ICD-10-CM | POA: Diagnosis not present

## 2023-08-15 ENCOUNTER — Telehealth: Payer: Self-pay

## 2023-08-15 NOTE — Telephone Encounter (Signed)
Faxed plan of care to [facility]

## 2023-08-17 DIAGNOSIS — R269 Unspecified abnormalities of gait and mobility: Secondary | ICD-10-CM | POA: Diagnosis not present

## 2023-08-17 DIAGNOSIS — I10 Essential (primary) hypertension: Secondary | ICD-10-CM | POA: Diagnosis not present

## 2023-08-17 DIAGNOSIS — R202 Paresthesia of skin: Secondary | ICD-10-CM | POA: Diagnosis not present

## 2023-08-17 DIAGNOSIS — Z87891 Personal history of nicotine dependence: Secondary | ICD-10-CM | POA: Diagnosis not present

## 2023-08-21 ENCOUNTER — Ambulatory Visit
Admission: RE | Admit: 2023-08-21 | Discharge: 2023-08-21 | Disposition: A | Discharge status: Home / Self Care | Payer: Medicare Other | Source: Ambulatory Visit | Attending: Neurology | Admitting: Neurology

## 2023-08-21 DIAGNOSIS — R202 Paresthesia of skin: Secondary | ICD-10-CM

## 2023-08-21 DIAGNOSIS — R269 Unspecified abnormalities of gait and mobility: Secondary | ICD-10-CM

## 2023-08-22 ENCOUNTER — Telehealth: Payer: Self-pay | Admitting: Neurology

## 2023-08-22 NOTE — Telephone Encounter (Signed)
Lebron Conners, PT with [facility] reports pt is requesting to discontinue PT, he has met his goals.  Lebron Conners, PT with [facility]  can be called at [phone] if there are questions , her voicemail is secure

## 2023-08-24 ENCOUNTER — Telehealth: Payer: Self-pay | Admitting: Neurology

## 2023-08-24 NOTE — Telephone Encounter (Signed)
Called and spoke to [name] and relayed result. [name] voiced understanding that Dr. Terrace Arabia will go over it in more detail at nerve conduction study.

## 2023-08-24 NOTE — Telephone Encounter (Signed)
Please call patient, MRI of the cervical spine showed multilevel degenerative changes, most noticeable at C3-4, evidence of moderate canal stenosis AP diameter 7.1 mm, no cord signal abnormality  MRI of lumbar spine showed multilevel degenerative changes, variable degree of foraminal narrowing at different levels  I will review MRI findings in detail at his scheduled EMG nerve conduction study in December     IMPRESSION: This MRI of the cervical spine without contrast shows the following: The spinal cord appears normal.  At C3-C4, there is moderate spinal stenosis (AP diameter 7.1 mm) due to retrolisthesis and other degenerative change.  This causes moderately severe right foraminal narrowing which could affect the right C4 nerve root. Milder degenerative changes are noted at other cervical levels as detailed above that do not lead to spinal stenosis or nerve root compression.   IMPRESSION: This MRI of the lumbar spine without contrast shows the following: At L2-L3, there is trace retrolisthesis and other degenerative change causing mild spinal stenosis and moderate bilateral lateral recess stenosis though there does not appear to be any nerve root compression. At L3-L4, there are degenerative changes causing mild spinal stenosis and moderately severe left foraminal narrowing with some potential for left L3 nerve root compression. At L4-L5, there is anterolisthesis and other degenerative change causing moderate spinal stenosis and severe left foraminal narrowing, severe left lateral recess stenosis and moderately severe right lateral recess stenosis.  There is probable left L4 and L5 nerve root compression and possible right L5 nerve root compression. At L5-S1, there are degenerative changes causing moderately severe right foraminal narrowing with possible right L5 nerve root compression.

## 2023-08-26 DIAGNOSIS — Z87891 Personal history of nicotine dependence: Secondary | ICD-10-CM | POA: Diagnosis not present

## 2023-08-26 DIAGNOSIS — R202 Paresthesia of skin: Secondary | ICD-10-CM | POA: Diagnosis not present

## 2023-08-26 DIAGNOSIS — I10 Essential (primary) hypertension: Secondary | ICD-10-CM | POA: Diagnosis not present

## 2023-08-26 DIAGNOSIS — R269 Unspecified abnormalities of gait and mobility: Secondary | ICD-10-CM | POA: Diagnosis not present

## 2023-08-29 ENCOUNTER — Telehealth: Payer: Self-pay

## 2023-08-29 NOTE — Telephone Encounter (Signed)
Orders faxed to [facility]

## 2023-09-21 ENCOUNTER — Telehealth: Payer: Self-pay | Admitting: Neurology

## 2023-09-21 ENCOUNTER — Encounter: Payer: Self-pay | Admitting: Neurology

## 2023-09-21 NOTE — Telephone Encounter (Signed)
LVM and sent letter in mail informing pt of need to reschedule 09/30/23 NCV/EMG appt - MD out

## 2023-09-30 ENCOUNTER — Encounter: Payer: Medicare Other | Admitting: Neurology

## 2023-10-28 DIAGNOSIS — H6123 Impacted cerumen, bilateral: Secondary | ICD-10-CM | POA: Diagnosis not present

## 2023-10-28 DIAGNOSIS — Z0000 Encounter for general adult medical examination without abnormal findings: Secondary | ICD-10-CM | POA: Diagnosis not present

## 2023-10-28 DIAGNOSIS — E7800 Pure hypercholesterolemia, unspecified: Secondary | ICD-10-CM | POA: Diagnosis not present

## 2023-10-28 DIAGNOSIS — R809 Proteinuria, unspecified: Secondary | ICD-10-CM | POA: Diagnosis not present

## 2023-10-28 DIAGNOSIS — R011 Cardiac murmur, unspecified: Secondary | ICD-10-CM | POA: Diagnosis not present

## 2023-10-28 DIAGNOSIS — Z79899 Other long term (current) drug therapy: Secondary | ICD-10-CM | POA: Diagnosis not present

## 2023-10-28 DIAGNOSIS — E1140 Type 2 diabetes mellitus with diabetic neuropathy, unspecified: Secondary | ICD-10-CM | POA: Diagnosis not present

## 2023-10-28 DIAGNOSIS — G629 Polyneuropathy, unspecified: Secondary | ICD-10-CM | POA: Diagnosis not present

## 2023-10-28 DIAGNOSIS — I700 Atherosclerosis of aorta: Secondary | ICD-10-CM | POA: Diagnosis not present

## 2023-10-28 DIAGNOSIS — I10 Essential (primary) hypertension: Secondary | ICD-10-CM | POA: Diagnosis not present

## 2023-11-22 ENCOUNTER — Encounter: Payer: Self-pay | Admitting: Podiatry

## 2023-11-22 ENCOUNTER — Ambulatory Visit: Payer: Medicare Other | Admitting: Podiatry

## 2023-11-22 DIAGNOSIS — M79675 Pain in left toe(s): Secondary | ICD-10-CM

## 2023-11-22 DIAGNOSIS — M79674 Pain in right toe(s): Secondary | ICD-10-CM

## 2023-11-22 DIAGNOSIS — Q828 Other specified congenital malformations of skin: Secondary | ICD-10-CM

## 2023-11-22 DIAGNOSIS — B351 Tinea unguium: Secondary | ICD-10-CM | POA: Diagnosis not present

## 2023-11-22 DIAGNOSIS — E1149 Type 2 diabetes mellitus with other diabetic neurological complication: Secondary | ICD-10-CM | POA: Diagnosis not present

## 2023-11-22 NOTE — Patient Instructions (Signed)

## 2023-11-23 DIAGNOSIS — R011 Cardiac murmur, unspecified: Secondary | ICD-10-CM | POA: Diagnosis not present

## 2023-11-23 NOTE — Progress Notes (Signed)
Subjective: Chief Complaint  Patient presents with   [facility]    RM#12 Patient states has calluses and needs nails trimmed and has tingling all the time on both feet.    83 y.o. returns the office today for thick, discolored toenails that he is unable to himself as well as for callus on the left foot.  He is on gabapentin for neuropathy.  No open lesions.  No other concerns.   Aliene Beams, MD- last seen 10/29/2022   Objective: AAO 3, NAD DP/PT pulses palpable, CRT less than 3 seconds Sensation decreased with Semmes Weinstein monofilament. Nails hypertrophic, dystrophic, elongated, brittle, discolored 10. There is tenderness overlying the nails 1-5 bilaterally. There is no surrounding erythema or drainage along the nail sites. Hyperkeratotic lesions noted submetatarsal 4 on the left side as well as right submetatarsal 1 and 5 without any underlying ulceration drainage or signs of infection. Prominence of metatarsal heads plantarly after fat pad.  MMT 5/5. No open lesions noted bilaterally. No pain with calf compression, swelling, warmth, erythema.   Assessment: Patient presents with symptomatic onychomycosis; hyperkeratotic lesions; neuropathy  Plan: -Treatment options including alternatives, risks, complications were discussed -Nails sharply debrided 10 without complication/bleeding. -Hyperkeratotic lesion sharply debrided x 3 without any complications or bleeding.   -Gabapentin for neuropathy. -Discussed daily foot inspection. If there are any changes, to call the office immediately.   Return in about 3 months (around 02/19/2024).  Ovid Curd, DPM

## 2023-11-29 ENCOUNTER — Telehealth: Payer: Self-pay

## 2023-11-29 NOTE — Telephone Encounter (Signed)
 I left the patient a voicemail informing him that the office would be closed tomorrow and his appointment was cancelled due to weather. The office number was left for any questions or concerns. We will call the patient back for an appointment once the providers future EMG/NCV schedules are created.

## 2023-11-30 ENCOUNTER — Encounter: Payer: Medicare Other | Admitting: Neurology

## 2023-12-07 ENCOUNTER — Ambulatory Visit: Payer: Self-pay | Admitting: Neurology

## 2023-12-07 ENCOUNTER — Ambulatory Visit: Payer: Medicare Other | Admitting: Neurology

## 2023-12-07 DIAGNOSIS — R269 Unspecified abnormalities of gait and mobility: Secondary | ICD-10-CM

## 2023-12-07 DIAGNOSIS — G629 Polyneuropathy, unspecified: Secondary | ICD-10-CM | POA: Diagnosis not present

## 2023-12-07 DIAGNOSIS — R202 Paresthesia of skin: Secondary | ICD-10-CM

## 2023-12-07 DIAGNOSIS — Z0289 Encounter for other administrative examinations: Secondary | ICD-10-CM

## 2023-12-07 NOTE — Procedures (Signed)
 Full Name: [name] Gender: Male MRN #: [id number] Date of Birth: 12/12/1940    Visit Date: 12/07/2023 12:40 Age: 83 Years Examining Physician: Dr. Levert Feinstein Referring Physician: Dr. Levert Feinstein Height: 5 feet 8 inch History: 83 year old male complains of worsening gait abnormality lower extremity paresthesia, chronic neck pain  Summary of the test: Nerve conduction study: Bilateral sural, superficial peroneal, left median, ulnar and radial sensory responses were absent.  Bilateral tibial, left peroneal to EDB motor response showed significantly decreased CMAP amplitude.  Right peroneal to EDB motor response was absent.  Left median motor response showed moderately prolonged distal latency with significantly decreased CMAP amplitude.  Left ulnar motor response showed mildly decreased CMAP amplitude, within normal range distal latency conduction velocity and F-wave latency  Electromyography: Selected needle examinations were performed at bilateral lower extremity muscles, lumbosacral paraspinal muscles, left upper extremity muscles and paraspinal muscles.  Evidence of chronic neuropathic changes involving bilateral L4-5 S1 myotomes.  There was no spontaneous activity at bilateral lumbosacral paraspinal muscles.  There was also evidence of chronic neuropathic changes involving left cervical C5-7 myotomes.  There was no spontaneous activity at left cervical paraspinal muscles.  Conclusion: This is an abnormal study.  There is electrodiagnostic evidence of moderately severe axonal sensorimotor polyneuropathy.  There is also evidence of chronic bilateral lumbosacral radiculopathy, involving bilateral L4-5 S1 myotomes; chronic left cervical radiculopathy involving left C5-7 myotomes.  There is no evidence of active process.   Levert Feinstein. M.D. Ph.D.   [facility] [address] [place], [place] [address] Tel: [phone] Fax: [phone]  Verbal  informed consent was obtained from the patient, patient was informed of potential risk of procedure, including bruising, bleeding, hematoma formation, infection, muscle weakness, muscle pain, numbness, among others.        MNC    Nerve / Sites Muscle Latency Ref. Amplitude Ref. Rel Amp Segments Distance Velocity Ref. Area    ms ms mV mV %  cm m/s m/s mVms  L Median - APB     Wrist APB 5.8 <=4.4 0.7 >=4.0 100 Wrist - APB 7   2.1     Upper arm APB 11.1  0.4  60.7 Upper arm - Wrist 26.6 50 >=49 1.1  L Ulnar - ADM     Wrist ADM 2.8 <=3.3 5.6 >=6.0 100 Wrist - ADM 7   18.6     B.Elbow ADM 4.9  4.7  84.2 B.Elbow - Wrist 11 53 >=49 14.8     A.Elbow ADM 8.1  4.6  97.9 A.Elbow - B.Elbow 18.6 58 >=49 17.0  L Peroneal - EDB     Ankle EDB 8.7 <=6.5 0.9 >=2.0 100 Ankle - EDB 9   3.3     Fib head EDB 19.6  0.6  68.3 Fib head - Ankle 28 26 >=44 2.0     Pop fossa EDB 18.1  0.8  126 Pop fossa - Fib head 11 72 >=44 3.2         Pop fossa - Ankle      R Peroneal - EDB     Ankle EDB NR <=6.5 NR >=2.0 NR Ankle - EDB 9   NR         Pop fossa - Ankle      L Tibial - AH     Ankle AH 5.7 <=5.8 1.4 >=4.0 100 Ankle - AH 9   2.9     Pop fossa AH 15.1  1.0  73.6 Pop fossa - Ankle 39 41 >=41 2.4  R Tibial - AH     Ankle AH 5.7 <=5.8 0.8 >=4.0 100 Ankle - AH 9   2.0     Pop fossa AH 16.4  0.7  86 Pop fossa - Ankle 38 35 >=41 1.4                 SNC    Nerve / Sites Rec. Site Peak Lat Ref.  Amp Ref. Segments Distance    ms ms V V  cm  L Radial - Anatomical snuff box (Forearm)     Forearm Wrist NR <=2.9 NR >=15 Forearm - Wrist 10  L Sural - Ankle (Calf)     Calf Ankle NR <=4.4 NR >=6 Calf - Ankle 14  R Sural - Ankle (Calf)     Calf Ankle NR <=4.4 NR >=6 Calf - Ankle 14  L Superficial peroneal - Ankle     Lat leg Ankle NR <=4.4 NR >=6 Lat leg - Ankle 14  R Superficial peroneal - Ankle     Lat leg Ankle NR <=4.4 NR >=6 Lat leg - Ankle 14  L Median - Orthodromic (Dig II, Mid palm)     Dig II Wrist NR <=3.4  NR >=10 Dig II - Wrist 13  L Ulnar - Orthodromic, (Dig V, Mid palm)     Dig V Wrist NR <=3.1 NR >=5 Dig V - Wrist 79                   F  Wave    Nerve F Lat Ref.   ms ms  L Tibial - AH NR <=56.0  R Tibial - AH NR <=56.0  L Ulnar - ADM 31.3 <=32.0           EMG Summary Table    Spontaneous MUAP Recruitment  Muscle IA Fib PSW Fasc Other Amp Dur. Poly Pattern  R. Tibialis anterior Increased None None None _______ Increased Increased 1+ Reduced  R. Tibialis posterior Increased None None None _______ Increased Increased 1+ Reduced  R. Peroneus longus Increased None None None _______ Increased Increased 1+ Reduced  R. Gastrocnemius (Medial head) Normal None None None _______ Increased Increased 1+ Reduced  R. Vastus lateralis Normal None None None _______ Increased Increased 1+ Reduced  L. Tibialis anterior Increased None None None _______ Increased Increased 1+ Reduced  L. Tibialis posterior Increased None None None _______ Increased Increased 1+ Reduced  L. Peroneus longus Increased None None None _______ Increased Increased 1+ Reduced  L. Gastrocnemius (Medial head) Increased None None None _______ Increased Increased 1+ Reduced  L. Vastus lateralis Increased None None None _______ Normal Normal Normal Reduced  L. Lumbar paraspinals (low) Normal None None None _______ Normal Normal Normal Normal  L. Lumbar paraspinals (mid) Normal None None None _______ Normal Normal Normal Normal  R. Lumbar paraspinals (low) Normal None None None _______ Normal Normal Normal Normal  L. First dorsal interosseous Increased None None None _______ Normal Normal Normal Reduced  L. Pronator teres Normal None None None _______ Increased Increased 1+ Reduced  L. Biceps brachii Normal None None None _______ Increased Increased 1+ Reduced  L. Deltoid Normal None None None _______ Increased Increased 1+ Reduced  L. Triceps brachii Normal None None None _______ Increased Increased 1+ Reduced  L. Extensor  digitorum communis Normal None None None _______ Increased Increased 1+ Reduced  L. Cervical paraspinals Normal None None None _______ Normal  Normal Normal Normal

## 2023-12-07 NOTE — Progress Notes (Signed)
 ASSESSMENT AND PLAN  [name] is a 83 y.o. male   Slow worsening gait abnormalities, urinary urgency History of lumbar decompression surgery  EMG nerve conduction study today confirmed to moderately severe axonal sensorimotor polyneuropathy, chronic bilateral lumbar radiculopathy, also evidence of chronic mild left cervical radiculopathy,  MRI of cervical spine multilevel degenerative changes, most noticeable at C3-4, moderate stenosis, causing moderate severe right foraminal narrowing,  MRI of lumbar spine, multilevel degenerative changes, most noticeable L4-5, moderate canal stenosis, variable degree of foraminal narrowing   He does not want to consider any intervention now, referred to home physical therapy Laboratory evaluation to rule out treatable etiology for his peripheral neuropathy  DIAGNOSTIC DATA (LABS, IMAGING, TESTING) - I reviewed patient records, labs, notes, testing and imaging myself where available.   MEDICAL HISTORY:  [name] is a 83 year old male,, seen in request by his primary care from [facility] Dr. Tracie Harrier, [place], for evaluation of slow worsening gait abnormality, lower extremity paresthesia, initial evaluation July 26, 2023  History is obtained from the patient and review of electronic medical records. I personally reviewed pertinent available imaging films in PACS.   PMHx of  HTN HLD Depression, anxiety Sleep apnea, -CPAP BPH Lumbar decompression  Since 2019, he noticed bilateral feet paresthesia, fairly symmetric, mainly involving plantar surface, gradually getting worse, now complains of stinging pain, getting worse after weightbearing, he have to change his shoes frequently, since 2023, he noticed gradual worsening gait abnormality, uses cane sometimes, also noticed bilateral fingertips numbness tingling, he also complains of worsening urinary urgency, had a history of lumbar decompression surgery many years ago  He is the main  caregiver of his wife, who suffered Parkinson's disease, pelvic fracture, just recently improving from wheelchair to walker,  Lab from [place] In July 2024 B12, folic acid more than 20, LDL 69, normal CMP creatinine of 0.78, A1c 6.3, normal TSH 2.4,   UPDATE March 7th 2025: Electrodiagnostic study today which showed evidence of moderate axonal sensorimotor polyneuropathy, mild chronic bilateral lumbar radiculopathy, chronic mild left cervical radiculopathy involving left C5- 7  We personally reviewed MRI of cervical spine multilevel degenerative changes, most noticeable at C3-4, moderate stenosis, causing moderate severe right foraminal narrowing,  MRI of lumbar spine, multilevel degenerative changes, most noticeable L4-5, moderate canal stenosis, variable degree of foraminal narrowing  He is the main caregiver of his wife who suffered Parkinson's disease, he does not want to consider any intervention now   PHYSICAL EXAM:      07/26/2023    1:55 PM 06/27/2023   10:38 AM 01/07/2022   12:28 PM  Vitals with BMI  Height 5\' 8"  5\' 11"  5\' 11"   Weight 257 lbs 256 lbs 250 lbs  BMI 39.09 35.72 34.88  Systolic 147 125 161  Diastolic 72 75 76  Pulse 57 60 68    PHYSICAL EXAMNIATION:  Gen: NAD, conversant, well nourised, well groomed                     Cardiovascular: Regular rate rhythm, no peripheral edema, warm, nontender. Eyes: Conjunctivae clear without exudates or hemorrhage Neck: Supple, no carotid bruits.  Limitation range of motion of neck turning Pulmonary: Clear to auscultation bilaterally   NEUROLOGICAL EXAM:  MENTAL STATUS: Speech/cognition: Awake, alert, oriented to history taking and casual conversation CRANIAL NERVES: CN II: Visual fields are full to confrontation. Pupils are round equal and briskly reactive to light. CN III, IV, VI: extraocular movement are  normal. No ptosis. CN V: Facial sensation is intact to light touch CN VII: Face is symmetric with normal eye  closure  CN VIII: Hearing is normal to causal conversation. CN IX, X: Phonation is normal. CN XI: Head turning and shoulder shrug are intact  MOTOR: Mild bilateral hand intrinsic muscle atrophy, with mild bilateral finger abduction weakness.  No significant proximal upper and lower extremity muscle weakness, moderate bilateral toe flexion extension weakness, right worse than left.  REFLEXES: Reflexes are absent.  SENSORY: Discoloration of distal lower extremity   Coordination: There is no trunk or limb dysmetria noted.  GAIT/STANCE: Push-up to get up from seated position, wide-based, unsteady, could not stand up on tiptoe and heels,    REVIEW OF SYSTEMS:  Full 14 system review of systems performed and notable only for as above All other review of systems were negative.   ALLERGIES: Allergies  Allergen Reactions   Quinapril Hcl     cough   Quinapril Hcl Other (See Comments)    Cough; "Threw me for a loop"    HOME MEDICATIONS: Current Outpatient Medications  Medication Sig Dispense Refill   ACCU-CHEK GUIDE test strip USE AS DIRECTED ONCE A DAY     amLODipine (NORVASC) 10 MG tablet Take 10 mg by mouth daily.     amLODipine (NORVASC) 10 MG tablet Take by mouth.     aspirin EC 81 MG tablet Take 81 mg by mouth every evening.     atorvastatin (LIPITOR) 40 MG tablet Take 1 tablet by mouth daily.     B Complex, Folic Acid, TABS Take 1 tablet by mouth daily.     betamethasone dipropionate 0.05 % cream 1 application     Calcium Carbonate-Vitamin D (CALCIUM 600 + D PO) Take 1 tablet by mouth daily.     Calcium Carbonate-Vitamin D 600-200 MG-UNIT CAPS Take 1 tablet by mouth daily.     cholecalciferol (VITAMIN D) 1000 UNITS tablet Take 1,000 Units by mouth daily.     ciclopirox (PENLAC) 8 % solution Apply topically at bedtime. Apply over nail and surrounding skin. Apply daily over previous coat. After seven (7) days, may remove with alcohol and continue cycle. 6.6 mL 2   dorzolamide  (TRUSOPT) 2 % ophthalmic solution 1 drop 2 (two) times daily.     dorzolamide-timolol (COSOPT) 22.3-6.8 MG/ML ophthalmic solution INSTILL 1 DROP IN OU Q 12 H  3   gabapentin (NEURONTIN) 100 MG capsule Take 1 capsule (100 mg total) by mouth at bedtime. (Patient taking differently: Take 100 mg by mouth 2 (two) times daily.) 90 capsule 0   Garlic 1000 MG CAPS Take 1,000 mg by mouth daily.     lisinopril (ZESTRIL) 2.5 MG tablet Take 2.5 mg by mouth daily.     meclizine (ANTIVERT) 25 MG tablet Take 1 tablet (25 mg total) by mouth 3 (three) times daily as needed for dizziness. 75 tablet 1   mupirocin ointment (BACTROBAN) 2 % Apply 1 Application topically 2 (two) times daily. 30 g 2   omeprazole (PRILOSEC) 20 MG capsule Take 20 mg by mouth daily.     silodosin (RAPAFLO) 8 MG CAPS capsule Take 1 capsule (8 mg total) by mouth daily with breakfast. 30 capsule 11   tiZANidine (ZANAFLEX) 4 MG tablet 1/2 tablet as needed     triamcinolone lotion (KENALOG) 0.1 % 1 application     venlafaxine XR (EFFEXOR-XR) 150 MG 24 hr capsule TAKE ONE CAPSULE BY MOUTH EVERY DAY 90 capsule 0  Vitamins/Minerals TABS Take 1 tablet by mouth daily.     Zinc 50 MG TABS Take 50 mg by mouth daily.     No current facility-administered medications for this visit.    PAST MEDICAL HISTORY: Past Medical History:  Diagnosis Date   Anxiety    Arthritis    BPH (benign prostatic hyperplasia)    Depression    GERD (gastroesophageal reflux disease)    Hemorrhoid    Hyperlipidemia    Hypertension    Sleep apnea    wears CPAP    PAST SURGICAL HISTORY: Past Surgical History:  Procedure Laterality Date   BACK SURGERY     cataract with IOL OD     Cataract with IOL OS- complications requiring  laser therapy and drops     CHOLECYSTECTOMY     NASAL SINUS SURGERY     PARS PLANA VITRECTOMY Left 12/26/2012   Procedure: PARS PLANA VITRECTOMY WITH 25G REMOVAL/SUTURE INTRAOCULAR LENS;  Surgeon: Sherrie George, MD;  Location: [facility] OR;   Service: Ophthalmology;  Laterality: Left;   SEPTOPLASTY     teeth extracted     '08   TONSILLECTOMY     VITERECTOMY Left 12/26/2012   Dr Ashley Royalty    FAMILY HISTORY: Family History  Problem Relation Age of Onset   Hyperlipidemia Mother    Hypertension Mother    Cancer Father        lung cancer    SOCIAL HISTORY: Social History   Socioeconomic History   Marital status: Married    Spouse name: brenda   Number of children: 3   Years of education: 18   Highest education level: Not on file  Occupational History   Occupation: Copywriter, advertising: [employer]  Tobacco Use   Smoking status: Former    Types: Cigarettes   Smokeless tobacco: Never   Tobacco comments:    quit 15 years ago  Substance and Sexual Activity   Alcohol use: No   Drug use: No   Sexual activity: Not Currently  Other Topics Concern   Not on file  Social History Narrative   [employer]-BA, [employer]- MDiv. Married 1966. 3 boys, - '68, '73, '74   3 grandchildren. 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 SO- medical problems.ACP -End of life: no cpr, no prolonged Ventilator support, would consider HD, no heroic life- sustaining therapy, e.g. Artificial feeding or hydration in the long term.    Retired in August 2014.   Social Drivers of Corporate investment banker Strain: Not on file  Food Insecurity: Not on file  Transportation Needs: Not on file  Physical Activity: Not on file  Stress: Not on file  Social Connections: Not on file  Intimate Partner Violence: Not on file      Levert Feinstein, M.D. Ph.D.  [facility] [address] [place], [place] [address] Ph: [phone] Fax: [phone]  CC:  Aliene Beams, MD [phone] [place],  [place] [address]  Aliene Beams, MD

## 2023-12-08 ENCOUNTER — Telehealth: Payer: Self-pay | Admitting: Neurology

## 2023-12-08 DIAGNOSIS — R202 Paresthesia of skin: Secondary | ICD-10-CM | POA: Diagnosis not present

## 2023-12-08 DIAGNOSIS — R269 Unspecified abnormalities of gait and mobility: Secondary | ICD-10-CM | POA: Diagnosis not present

## 2023-12-08 DIAGNOSIS — I10 Essential (primary) hypertension: Secondary | ICD-10-CM | POA: Diagnosis not present

## 2023-12-08 NOTE — Telephone Encounter (Signed)
 [facility] will be taking this patient.

## 2023-12-09 ENCOUNTER — Telehealth: Payer: Self-pay | Admitting: Neurology

## 2023-12-09 NOTE — Telephone Encounter (Signed)
 Liji from [facility] called needing VO for Home Health PT with the frequency of  1 x1w 2 x 2w 1 x 4w to work on gait balance and strengthening  Please advise.

## 2023-12-09 NOTE — Telephone Encounter (Signed)
 Gave VO for Home Health PT on 12/09/2023

## 2023-12-12 LAB — MULTIPLE MYELOMA PANEL, SERUM
Albumin SerPl Elph-Mcnc: 3.5 g/dL (ref 2.9–4.4)
Albumin/Glob SerPl: 1.1 (ref 0.7–1.7)
Alpha 1: 0.3 g/dL (ref 0.0–0.4)
Alpha2 Glob SerPl Elph-Mcnc: 0.8 g/dL (ref 0.4–1.0)
B-Globulin SerPl Elph-Mcnc: 1 g/dL (ref 0.7–1.3)
Gamma Glob SerPl Elph-Mcnc: 1.2 g/dL (ref 0.4–1.8)
Globulin, Total: 3.3 g/dL (ref 2.2–3.9)
IgA/Immunoglobulin A, Serum: 243 mg/dL (ref 61–437)
IgG (Immunoglobin G), Serum: 1322 mg/dL (ref 603–1613)
IgM (Immunoglobulin M), Srm: 37 mg/dL (ref 15–143)

## 2023-12-12 LAB — COMPREHENSIVE METABOLIC PANEL
ALT: 19 IU/L (ref 0–44)
AST: 29 IU/L (ref 0–40)
Albumin: 3.9 g/dL (ref 3.8–4.8)
Alkaline Phosphatase: 110 IU/L (ref 44–121)
BUN/Creatinine Ratio: 16 (ref 10–24)
BUN: 14 mg/dL (ref 8–27)
Bilirubin Total: 0.5 mg/dL (ref 0.0–1.2)
CO2: 26 mmol/L (ref 20–29)
Calcium: 9.7 mg/dL (ref 8.6–10.2)
Chloride: 103 mmol/L (ref 96–106)
Creatinine, Ser: 0.85 mg/dL (ref 0.76–1.27)
Globulin, Total: 2.9 g/dL (ref 1.5–4.5)
Glucose: 75 mg/dL (ref 70–99)
Potassium: 4.4 mmol/L (ref 3.5–5.2)
Sodium: 140 mmol/L (ref 134–144)
Total Protein: 6.8 g/dL (ref 6.0–8.5)
eGFR: 88 mL/min/{1.73_m2} (ref >59)

## 2023-12-12 LAB — RPR, QUANT+TP ABS (REFLEX)
Rapid Plasma Reagin, Quant: 1:1 {titer} — ABNORMAL HIGH
T Pallidum Abs: REACTIVE — AB

## 2023-12-12 LAB — CBC WITH DIFFERENTIAL/PLATELET
Basophils Absolute: 0 10*3/uL (ref 0.0–0.2)
Basos: 0 %
EOS (ABSOLUTE): 0.1 10*3/uL (ref 0.0–0.4)
Eos: 1 %
Hematocrit: 45.7 % (ref 37.5–51.0)
Hemoglobin: 15.4 g/dL (ref 13.0–17.7)
Immature Grans (Abs): 0.1 10*3/uL (ref 0.0–0.1)
Immature Granulocytes: 1 %
Lymphocytes Absolute: 3.1 10*3/uL (ref 0.7–3.1)
Lymphs: 45 %
MCH: 31.6 pg (ref 26.6–33.0)
MCHC: 33.7 g/dL (ref 31.5–35.7)
MCV: 94 fL (ref 79–97)
Monocytes Absolute: 0.7 10*3/uL (ref 0.1–0.9)
Monocytes: 10 %
Neutrophils Absolute: 3 10*3/uL (ref 1.4–7.0)
Neutrophils: 43 %
Platelets: 192 10*3/uL (ref 150–450)
RBC: 4.88 x10E6/uL (ref 4.14–5.80)
RDW: 13 % (ref 11.6–15.4)
WBC: 7 10*3/uL (ref 3.4–10.8)

## 2023-12-12 LAB — FOLATE: Folate: >20 ng/mL (ref >3.0)

## 2023-12-12 LAB — RPR: RPR Ser Ql: REACTIVE — AB

## 2023-12-12 LAB — VITAMIN D 25 HYDROXY (VIT D DEFICIENCY, FRACTURES): Vit D, 25-Hydroxy: 36 ng/mL (ref 30.0–100.0)

## 2023-12-12 LAB — TSH: TSH: 2.42 u[IU]/mL (ref 0.450–4.500)

## 2023-12-12 LAB — CK: Total CK: 108 U/L (ref 41–331)

## 2023-12-12 LAB — VITAMIN B12: Vitamin B-12: 948 pg/mL (ref 232–1245)

## 2023-12-12 LAB — C-REACTIVE PROTEIN: CRP: 2 mg/L (ref 0–10)

## 2023-12-12 LAB — HGB A1C W/O EAG: Hgb A1c MFr Bld: 6.4 % — ABNORMAL HIGH (ref 4.8–5.6)

## 2023-12-12 LAB — ANA W/REFLEX IF POSITIVE: Anti Nuclear Antibody (ANA): NEGATIVE

## 2023-12-12 LAB — SEDIMENTATION RATE: Sed Rate: 12 mm/h (ref 0–30)

## 2023-12-13 DIAGNOSIS — I10 Essential (primary) hypertension: Secondary | ICD-10-CM | POA: Diagnosis not present

## 2023-12-13 DIAGNOSIS — R202 Paresthesia of skin: Secondary | ICD-10-CM | POA: Diagnosis not present

## 2023-12-13 DIAGNOSIS — R269 Unspecified abnormalities of gait and mobility: Secondary | ICD-10-CM | POA: Diagnosis not present

## 2023-12-14 ENCOUNTER — Telehealth: Payer: Self-pay | Admitting: Neurology

## 2023-12-14 DIAGNOSIS — G629 Polyneuropathy, unspecified: Secondary | ICD-10-CM

## 2023-12-14 DIAGNOSIS — R202 Paresthesia of skin: Secondary | ICD-10-CM

## 2023-12-14 NOTE — Telephone Encounter (Signed)
 I called patient laboratory result, A1c was elevated at 6.4, consistent with diagnosis of diabetes,  Positive RPR with titer of 1-1, that was confirmed by T pallidus antibody testing, he denies a history of exposure or treatment in the past  Agreed to be referred to ID  Rest of the laboratory evaluation showed no significant abnormalities.

## 2023-12-15 NOTE — Progress Notes (Signed)
 EMG nerve conduction study report is on the procedure tab

## 2023-12-16 DIAGNOSIS — I10 Essential (primary) hypertension: Secondary | ICD-10-CM | POA: Diagnosis not present

## 2023-12-16 DIAGNOSIS — R202 Paresthesia of skin: Secondary | ICD-10-CM | POA: Diagnosis not present

## 2023-12-16 DIAGNOSIS — R269 Unspecified abnormalities of gait and mobility: Secondary | ICD-10-CM | POA: Diagnosis not present

## 2023-12-19 DIAGNOSIS — R202 Paresthesia of skin: Secondary | ICD-10-CM | POA: Diagnosis not present

## 2023-12-19 DIAGNOSIS — I10 Essential (primary) hypertension: Secondary | ICD-10-CM | POA: Diagnosis not present

## 2023-12-19 DIAGNOSIS — R269 Unspecified abnormalities of gait and mobility: Secondary | ICD-10-CM | POA: Diagnosis not present

## 2023-12-21 ENCOUNTER — Telehealth: Payer: Self-pay

## 2023-12-21 NOTE — Telephone Encounter (Signed)
 Faxed Home Health Certification and Plan of Care to [facility] [phone] on 12/21/23

## 2023-12-23 DIAGNOSIS — R269 Unspecified abnormalities of gait and mobility: Secondary | ICD-10-CM | POA: Diagnosis not present

## 2023-12-23 DIAGNOSIS — I10 Essential (primary) hypertension: Secondary | ICD-10-CM | POA: Diagnosis not present

## 2023-12-23 DIAGNOSIS — R202 Paresthesia of skin: Secondary | ICD-10-CM | POA: Diagnosis not present

## 2023-12-28 DIAGNOSIS — I10 Essential (primary) hypertension: Secondary | ICD-10-CM | POA: Diagnosis not present

## 2023-12-28 DIAGNOSIS — R269 Unspecified abnormalities of gait and mobility: Secondary | ICD-10-CM | POA: Diagnosis not present

## 2023-12-28 DIAGNOSIS — R202 Paresthesia of skin: Secondary | ICD-10-CM | POA: Diagnosis not present

## 2024-01-02 DIAGNOSIS — R269 Unspecified abnormalities of gait and mobility: Secondary | ICD-10-CM | POA: Diagnosis not present

## 2024-01-02 DIAGNOSIS — I10 Essential (primary) hypertension: Secondary | ICD-10-CM | POA: Diagnosis not present

## 2024-01-02 DIAGNOSIS — R202 Paresthesia of skin: Secondary | ICD-10-CM | POA: Diagnosis not present

## 2024-01-05 ENCOUNTER — Ambulatory Visit: Admitting: Internal Medicine

## 2024-01-05 ENCOUNTER — Other Ambulatory Visit: Payer: Self-pay

## 2024-01-05 ENCOUNTER — Encounter: Payer: Self-pay | Admitting: Internal Medicine

## 2024-01-05 VITALS — BP 125/74 | HR 96 | Resp 16 | Ht 68.0 in | Wt 256.2 lb

## 2024-01-05 DIAGNOSIS — A528 Late syphilis, latent: Secondary | ICD-10-CM
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
## 2024-01-05 NOTE — Patient Instructions (Signed)
 I do not feel you have reinfection with syphilis based on your history   Your titer as long as it is less than 4 is fine (if you have no other exposure to syphilis)   If you feel compelled to retest you could do this summer, and if the RPR titer is 4 or more then you need to repeat treatment

## 2024-01-09 DIAGNOSIS — I10 Essential (primary) hypertension: Secondary | ICD-10-CM | POA: Diagnosis not present

## 2024-01-09 DIAGNOSIS — R202 Paresthesia of skin: Secondary | ICD-10-CM | POA: Diagnosis not present

## 2024-01-09 DIAGNOSIS — R269 Unspecified abnormalities of gait and mobility: Secondary | ICD-10-CM | POA: Diagnosis not present

## 2024-01-18 DIAGNOSIS — I10 Essential (primary) hypertension: Secondary | ICD-10-CM | POA: Diagnosis not present

## 2024-01-18 DIAGNOSIS — R269 Unspecified abnormalities of gait and mobility: Secondary | ICD-10-CM | POA: Diagnosis not present

## 2024-01-18 DIAGNOSIS — R202 Paresthesia of skin: Secondary | ICD-10-CM | POA: Diagnosis not present

## 2024-02-16 ENCOUNTER — Ambulatory Visit: Admitting: Podiatry

## 2024-02-16 ENCOUNTER — Encounter: Payer: Self-pay | Admitting: Podiatry

## 2024-02-16 DIAGNOSIS — M79674 Pain in right toe(s): Secondary | ICD-10-CM

## 2024-02-16 DIAGNOSIS — M79675 Pain in left toe(s): Secondary | ICD-10-CM

## 2024-02-16 DIAGNOSIS — Q828 Other specified congenital malformations of skin: Secondary | ICD-10-CM | POA: Diagnosis not present

## 2024-02-16 DIAGNOSIS — E1149 Type 2 diabetes mellitus with other diabetic neurological complication: Secondary | ICD-10-CM | POA: Diagnosis not present

## 2024-02-16 DIAGNOSIS — B351 Tinea unguium: Secondary | ICD-10-CM

## 2024-02-16 NOTE — Progress Notes (Signed)
 Subjective: Chief Complaint  Patient presents with   [facility]    RM#11 [facility] patient has no concerns today.    83 y.o. returns the office today for thick, discolored toenails that he is unable to himself as well as for callus to the feet.  Does not report any open lesions.  No other concerns today.    He is on gabapentin  for neuropathy.  He follows with neurology.  Dorena Gander, MD- last seen 10/29/2022   Objective: AAO 3, NAD DP/PT pulses palpable, CRT less than 3 seconds Sensation decreased with Semmes Weinstein monofilament. Nails hypertrophic, dystrophic, elongated, brittle, discolored 10. There is tenderness overlying the nails 1-5 bilaterally. There is no surrounding erythema or drainage along the nail sites. Hyperkeratotic lesions noted submetatarsal 4 on the left side as well as right submetatarsal 1st without any underlying ulceration drainage or signs of infection. Prominence of metatarsal heads plantarly after fat pad.  MMT 5/5. No open lesions noted bilaterally. No pain with calf compression, swelling, warmth, erythema.   Assessment: Patient presents with symptomatic onychomycosis; hyperkeratotic lesions; neuropathy  Plan: -Treatment options including alternatives, risks, complications were discussed -Nails sharply debrided 10 without complication/bleeding. -Hyperkeratotic lesion sharply debrided x 2 without any complications or bleeding.   -Gabapentin  for neuropathy. -Discussed daily foot inspection. If there are any changes, to call the office immediately.   Return in about 9 weeks (around 04/19/2024) for nail/callus trim .  Bobbie Burows, DPM

## 2024-02-20 ENCOUNTER — Ambulatory Visit: Payer: Medicare Other | Admitting: Podiatry

## 2024-04-24 ENCOUNTER — Encounter: Payer: Self-pay | Admitting: Podiatry

## 2024-04-24 ENCOUNTER — Ambulatory Visit: Admitting: Podiatry

## 2024-04-24 VITALS — BP 146/84 | HR 69 | Temp 98.0°F | Resp 18 | Ht 68.0 in | Wt 256.0 lb

## 2024-04-24 DIAGNOSIS — Q828 Other specified congenital malformations of skin: Secondary | ICD-10-CM | POA: Diagnosis not present

## 2024-04-24 DIAGNOSIS — M79675 Pain in left toe(s): Secondary | ICD-10-CM | POA: Diagnosis not present

## 2024-04-24 DIAGNOSIS — B351 Tinea unguium: Secondary | ICD-10-CM | POA: Diagnosis not present

## 2024-04-24 DIAGNOSIS — E1149 Type 2 diabetes mellitus with other diabetic neurological complication: Secondary | ICD-10-CM

## 2024-04-24 DIAGNOSIS — M79674 Pain in right toe(s): Secondary | ICD-10-CM | POA: Diagnosis not present

## 2024-04-28 NOTE — Progress Notes (Signed)
 Subjective: Chief Complaint  Patient presents with   Nail Problem    Patient is here for RFC and nail trim     83 y.o. returns the office today for thick, discolored toenails that he is unable to himself as well as for callus to the feet.  Does not report any open lesions.  No other concerns today.   He is on gabapentin  for neuropathy.  He follows with neurology.  [name] Millman, MD- last seen 04/10/2024   Objective: AAO 3, NAD DP/PT pulses palpable, CRT less than 3 seconds Sensation decreased with Semmes Weinstein monofilament. Nails hypertrophic, dystrophic, elongated, brittle, discolored 10. There is tenderness overlying the nails 1-5 bilaterally. There is no surrounding erythema or drainage along the nail sites. Hyperkeratotic lesions noted submetatarsal 4 on the left side as well as right submetatarsal 1st without any underlying ulceration drainage or signs of infection. Prominence of metatarsal heads plantarly after fat pad.  MMT 5/5. No open lesions noted bilaterally. No pain with calf compression, swelling, warmth, erythema.   Assessment: Patient presents with symptomatic onychomycosis; hyperkeratotic lesions; neuropathy  Plan: -Treatment options including alternatives, risks, complications were discussed -Nails sharply debrided 10 without complication/bleeding. -Hyperkeratotic lesion sharply debrided x 2 without any complications or bleeding.   -Gabapentin  for neuropathy. -Discussed daily foot inspection. If there are any changes, to call the office immediately.   Return in about 9 weeks (around 06/26/2024) for nail/callus trim .  Donnice Fees, DPM

## 2024-05-07 DIAGNOSIS — H401131 Primary open-angle glaucoma, bilateral, mild stage: Secondary | ICD-10-CM | POA: Diagnosis not present

## 2024-05-07 DIAGNOSIS — E119 Type 2 diabetes mellitus without complications: Secondary | ICD-10-CM | POA: Diagnosis not present

## 2024-05-07 DIAGNOSIS — Z961 Presence of intraocular lens: Secondary | ICD-10-CM | POA: Diagnosis not present

## 2024-05-10 DIAGNOSIS — I10 Essential (primary) hypertension: Secondary | ICD-10-CM | POA: Diagnosis not present

## 2024-05-10 DIAGNOSIS — E7800 Pure hypercholesterolemia, unspecified: Secondary | ICD-10-CM | POA: Diagnosis not present

## 2024-06-06 DIAGNOSIS — R809 Proteinuria, unspecified: Secondary | ICD-10-CM | POA: Diagnosis not present

## 2024-06-06 DIAGNOSIS — Z79899 Other long term (current) drug therapy: Secondary | ICD-10-CM | POA: Diagnosis not present

## 2024-06-06 DIAGNOSIS — E1129 Type 2 diabetes mellitus with other diabetic kidney complication: Secondary | ICD-10-CM | POA: Diagnosis not present

## 2024-06-06 DIAGNOSIS — E7800 Pure hypercholesterolemia, unspecified: Secondary | ICD-10-CM | POA: Diagnosis not present

## 2024-06-06 DIAGNOSIS — E1140 Type 2 diabetes mellitus with diabetic neuropathy, unspecified: Secondary | ICD-10-CM | POA: Diagnosis not present

## 2024-06-06 DIAGNOSIS — K219 Gastro-esophageal reflux disease without esophagitis: Secondary | ICD-10-CM | POA: Diagnosis not present

## 2024-06-06 DIAGNOSIS — I700 Atherosclerosis of aorta: Secondary | ICD-10-CM | POA: Diagnosis not present

## 2024-06-06 DIAGNOSIS — I10 Essential (primary) hypertension: Secondary | ICD-10-CM | POA: Diagnosis not present

## 2024-06-06 DIAGNOSIS — Z23 Encounter for immunization: Secondary | ICD-10-CM | POA: Diagnosis not present

## 2024-06-06 DIAGNOSIS — G629 Polyneuropathy, unspecified: Secondary | ICD-10-CM | POA: Diagnosis not present

## 2024-06-10 DIAGNOSIS — E7800 Pure hypercholesterolemia, unspecified: Secondary | ICD-10-CM | POA: Diagnosis not present

## 2024-06-10 DIAGNOSIS — I10 Essential (primary) hypertension: Secondary | ICD-10-CM | POA: Diagnosis not present

## 2024-06-14 ENCOUNTER — Other Ambulatory Visit (HOSPITAL_COMMUNITY): Payer: Self-pay

## 2024-06-26 ENCOUNTER — Ambulatory Visit: Admitting: Podiatry

## 2024-07-10 DIAGNOSIS — I10 Essential (primary) hypertension: Secondary | ICD-10-CM | POA: Diagnosis not present

## 2024-07-10 DIAGNOSIS — E7800 Pure hypercholesterolemia, unspecified: Secondary | ICD-10-CM | POA: Diagnosis not present

## 2024-07-19 DIAGNOSIS — R8290 Unspecified abnormal findings in urine: Secondary | ICD-10-CM | POA: Diagnosis not present

## 2024-07-23 ENCOUNTER — Ambulatory Visit: Admitting: Podiatry

## 2024-07-23 ENCOUNTER — Encounter: Payer: Self-pay | Admitting: Podiatry

## 2024-07-23 DIAGNOSIS — M79674 Pain in right toe(s): Secondary | ICD-10-CM | POA: Diagnosis not present

## 2024-07-23 DIAGNOSIS — M79675 Pain in left toe(s): Secondary | ICD-10-CM | POA: Diagnosis not present

## 2024-07-23 DIAGNOSIS — Q828 Other specified congenital malformations of skin: Secondary | ICD-10-CM | POA: Diagnosis not present

## 2024-07-23 DIAGNOSIS — E1149 Type 2 diabetes mellitus with other diabetic neurological complication: Secondary | ICD-10-CM | POA: Diagnosis not present

## 2024-07-23 DIAGNOSIS — B351 Tinea unguium: Secondary | ICD-10-CM | POA: Diagnosis not present

## 2024-07-23 NOTE — Progress Notes (Unsigned)
 Subjective: Chief Complaint  Patient presents with   RFC    Rm 12 Routine foot care     83 y.o. returns the office today for thick, discolored toenails that he is unable to himself as well as for callus to the feet.  Does not report any open lesions.  No other concerns today.   He is on gabapentin  for neuropathy.  He follows with neurology   [name] Millman, MD- last seen 04/10/2024   Objective: AAO 3, NAD DP/PT pulses palpable, CRT less than 3 seconds Sensation decreased with Semmes Weinstein monofilament. Nails hypertrophic, dystrophic, elongated, brittle, discolored 10. There is tenderness overlying the nails 1-5 bilaterally. There is no surrounding erythema or drainage along the nail sites. Hyperkeratotic lesions noted submetatarsal 4 on the left side as well as right submetatarsal 1st without any underlying ulceration drainage or signs of infection. Prominence of metatarsal heads plantarly after fat pad.  MMT 5/5. No lacerations present. No pain with calf compression, swelling, warmth, erythema.   Assessment: Patient presents with symptomatic onychomycosis; hyperkeratotic lesions; neuropathy  Plan: -Treatment options including alternatives, risks, complications were discussed -Nails sharply debrided 10 without complication/bleeding. -Hyperkeratotic lesion sharply debrided x 2 without any complications or bleeding.   -Gabapentin  for neuropathy. -Discussed daily foot inspection. If there are any changes, to call the office immediately.   Return in about 3 months (around 10/23/2024).  Donnice Fees, DPM

## 2024-08-21 ENCOUNTER — Encounter: Payer: Self-pay | Admitting: Adult Health

## 2024-08-21 ENCOUNTER — Ambulatory Visit: Payer: Medicare Other | Admitting: Adult Health

## 2024-08-21 VITALS — BP 157/76 | HR 70 | Ht 71.0 in | Wt 269.2 lb

## 2024-08-21 DIAGNOSIS — G629 Polyneuropathy, unspecified: Secondary | ICD-10-CM | POA: Diagnosis not present

## 2024-08-21 NOTE — Progress Notes (Signed)
 Chief Complaint  Patient presents with   RM 3    Patient is here alone for Neuropathy - no questions at this time      ASSESSMENT AND PLAN  [name] is a 83 y.o. male   Polyneuropathy Bilateral lumbar radiculopathy  Overall stable without progression  Bilateral bottom of feet and hand dysesthesias  He prefers to continue gabapentin  300mg  nightly for now currently being rx'd by PCP. Discussed trying different topical ointments for potential benefit Discussed use of shoe orthotics which can help with painful symptoms during ambulation Highly recommend use of cane at all times for fall prevention, consider restart of PT in the future if needed    No further recommendations from neurological standpoint and can return back to PCP at this time.     DIAGNOSTIC DATA (LABS, IMAGING, TESTING) - I reviewed patient records, labs, notes, testing and imaging myself where available.   EMG/NCV 11/2023 confirmed to moderately severe axonal sensorimotor polyneuropathy, chronic bilateral lumbar radiculopathy, also evidence of chronic mild left cervical radiculopathy,  MRI C-spine 08/2023 multilevel degenerative changes, most noticeable at C3-4, moderate stenosis, causing moderate severe right foraminal narrowing,  MRI L-spine 08/2023 multilevel degenerative changes, most noticeable L4-5, moderate canal stenosis, variable degree of foraminal narrowing     MEDICAL HISTORY:   Update 08/21/2024 [name]: Patient returns for follow-up visit.  Reports overall he has been doing well since prior visit.  He denies any progression of neuropathy symptoms.  Painful symptoms primarily in bottom of feet bilaterally. He does have weakness in hands bilaterally as well as numbness, has difficulty holding on to objects. Does ambulate with cane, did have recent fall due to balance but thankfully no injury. Previously worked with [name] which he found helpful. Currently on gabapentin  300 mg nightly which he  finds helpful.      UPDATE March 7th 2025 [name]: Electrodiagnostic study today which showed evidence of moderate axonal sensorimotor polyneuropathy, mild chronic bilateral lumbar radiculopathy, chronic mild left cervical radiculopathy involving left C5- 7  We personally reviewed MRI of cervical spine multilevel degenerative changes, most noticeable at C3-4, moderate stenosis, causing moderate severe right foraminal narrowing,  MRI of lumbar spine, multilevel degenerative changes, most noticeable L4-5, moderate canal stenosis, variable degree of foraminal narrowing  He is the main caregiver of his wife who suffered Parkinson's disease, he does not want to consider any intervention now   Consult visit 07/26/2023 [name]: [name] is a 83 year old male,, seen in request by his primary care from [name] Dr. [name], [name], for evaluation of slow worsening gait abnormality, lower extremity paresthesia, initial evaluation July 26, 2023  History is obtained from the patient and review of electronic medical records. I personally reviewed pertinent available imaging films in PACS.   PMHx of  HTN HLD Depression, anxiety Sleep apnea, -CPAP BPH Lumbar decompression  Since 2019, he noticed bilateral feet paresthesia, fairly symmetric, mainly involving plantar surface, gradually getting worse, now complains of stinging pain, getting worse after weightbearing, he have to change his shoes frequently, since 2023, he noticed gradual worsening gait abnormality, uses cane sometimes, also noticed bilateral fingertips numbness tingling, he also complains of worsening urinary urgency, had a history of lumbar decompression surgery many years ago  He is the main caregiver of his wife, who suffered Parkinson's disease, pelvic fracture, just recently improving from wheelchair to walker,  Lab from [name] July 2024 B12, folic acid  more than 20, LDL 69, normal  CMP creatinine of 0.78, A1c 6.3, normal  TSH 2.4,        PHYSICAL EXAM:      08/21/2024    2:11 PM 04/24/2024    9:56 AM 01/05/2024   10:16 AM  Vitals with BMI  Height 5' 11 5' 8 5' 8  Weight 269 lbs 3 oz 256 lbs 256 lbs 3 oz  BMI 37.56 38.93 38.96  Systolic 157 146 874  Diastolic 76 84 74  Pulse 70 69 96    PHYSICAL EXAMNIATION:  Gen: NAD, conversant, well nourised, well groomed                     Cardiovascular: Regular rate rhythm, no peripheral edema, warm, nontender. Eyes: Conjunctivae clear without exudates or hemorrhage Neck: Supple, no carotid bruits.  Limitation range of motion of neck turning Pulmonary: Clear to auscultation bilaterally   NEUROLOGICAL EXAM:  MENTAL STATUS: Speech/cognition: Awake, alert, oriented to history taking and casual conversation CRANIAL NERVES: CN II: Visual fields are full to confrontation. Pupils are round equal and briskly reactive to light. CN III, IV, VI: extraocular movement are normal. No ptosis. CN V: Facial sensation is intact to light touch CN VII: Face is symmetric with normal eye closure  CN VIII: Hearing is normal to causal conversation. CN IX, X: Phonation is normal. CN XI: Head turning and shoulder shrug are intact  MOTOR: Mild bilateral hand intrinsic muscle atrophy, with mild bilateral finger abduction weakness.  No significant proximal upper and lower extremity muscle weakness, moderate bilateral toe flexion extension weakness, right worse than left.  REFLEXES: Reflexes are absent.  SENSORY: Discoloration of distal lower extremity   Coordination: There is no trunk or limb dysmetria noted.  GAIT/STANCE: Push-up to get up from seated position, wide-based, unsteady, could not stand up on tiptoe and heels,  ambulates with cane  REVIEW OF SYSTEMS:  Full 14 system review of systems performed and notable only for as above All other review of systems were negative.   ALLERGIES: Allergies  Allergen Reactions   Quinapril  Hcl     cough   Quinapril   Hcl Other (See Comments)    Cough; Threw me for a loop    HOME MEDICATIONS: Current Outpatient Medications  Medication Sig Dispense Refill   ACCU-CHEK GUIDE test strip USE AS DIRECTED ONCE A DAY     amLODipine  (NORVASC ) 10 MG tablet Take by mouth.     aspirin EC 81 MG tablet Take 81 mg by mouth every evening.     atorvastatin  (LIPITOR) 40 MG tablet Take 1 tablet by mouth daily.     B Complex, Folic Acid , TABS Take 1 tablet by mouth daily.     betamethasone  dipropionate 0.05 % cream 1 application     cholecalciferol  (VITAMIN D ) 1000 UNITS tablet Take 1,000 Units by mouth daily.     ciclopirox  (PENLAC ) 8 % solution Apply topically at bedtime. Apply over nail and surrounding skin. Apply daily over previous coat. After seven (7) days, may remove with alcohol and continue cycle. 6.6 mL 2   dorzolamide (TRUSOPT) 2 % ophthalmic solution 1 drop 2 (two) times daily.     dorzolamide-timolol (COSOPT) 22.3-6.8 MG/ML ophthalmic solution INSTILL 1 DROP IN OU Q 12 H  3   gabapentin  (NEURONTIN ) 300 MG capsule Take 300 mg by mouth at bedtime.     Garlic 1000 MG CAPS Take 1,000 mg by mouth daily.     lisinopril  (ZESTRIL ) 2.5 MG tablet Take 2.5 mg  by mouth daily.     meclizine  (ANTIVERT ) 25 MG tablet Take 1 tablet (25 mg total) by mouth 3 (three) times daily as needed for dizziness. 75 tablet 1   mupirocin  ointment (BACTROBAN ) 2 % Apply 1 Application topically 2 (two) times daily. 30 g 2   omeprazole (PRILOSEC) 20 MG capsule Take 20 mg by mouth daily.     silodosin  (RAPAFLO ) 8 MG CAPS capsule Take 1 capsule (8 mg total) by mouth daily with breakfast. 30 capsule 11   tiZANidine (ZANAFLEX) 4 MG tablet 1/2 tablet as needed     triamcinolone lotion (KENALOG) 0.1 % 1 application     venlafaxine  XR (EFFEXOR -XR) 150 MG 24 hr capsule TAKE ONE CAPSULE BY MOUTH EVERY DAY 90 capsule 0   Vitamins/Minerals TABS Take 1 tablet by mouth daily.     Zinc 50 MG TABS Take 50 mg by mouth daily.     No current  facility-administered medications for this visit.    PAST MEDICAL HISTORY: Past Medical History:  Diagnosis Date   Anxiety    Arthritis    BPH (benign prostatic hyperplasia)    Depression    GERD (gastroesophageal reflux disease)    Hemorrhoid    Hyperlipidemia    Hypertension    Sleep apnea    wears CPAP    PAST SURGICAL HISTORY: Past Surgical History:  Procedure Laterality Date   BACK SURGERY     cataract with IOL OD     Cataract with IOL OS- complications requiring  laser therapy and drops     CHOLECYSTECTOMY     NASAL SINUS SURGERY     PARS PLANA VITRECTOMY Left 12/26/2012   Procedure: PARS PLANA VITRECTOMY WITH 25G REMOVAL/SUTURE INTRAOCULAR LENS;  Surgeon: Norleen JONETTA Ku, MD;  Location: [facility] OR;  Service: Ophthalmology;  Laterality: Left;   SEPTOPLASTY     teeth extracted     '08   TONSILLECTOMY     VITERECTOMY Left 12/26/2012   Dr Ku    FAMILY HISTORY: Family History  Problem Relation Age of Onset   Hyperlipidemia Mother    Hypertension Mother    Cancer Father        lung cancer    SOCIAL HISTORY: Social History   Socioeconomic History   Marital status: Married    Spouse name: brenda   Number of children: 3   Years of education: 18   Highest education level: Not on file  Occupational History   Occupation: Copywriter, Advertising: [employer]  Tobacco Use   Smoking status: Former    Types: Cigarettes   Smokeless tobacco: Never   Tobacco comments:    quit 15 years ago  Substance and Sexual Activity   Alcohol use: No   Drug use: No   Sexual activity: Not Currently  Other Topics Concern   Not on file  Social History Narrative   [employer]-BA, [employer]- MD[employer]. Married 1966. 3 boys, - '68, '73, '743 grandchildren. 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 SO- medical problems.ACP -End of life: no cpr, no prolonged Ventilator support, would consider HD, no heroic life- sustaining therapy, e.g. Artificial feeding  or hydration in the long term. Retired in August 2014.         1 cup/mug of coffee daily and tea throughout the day    Social Drivers of Corporate Investment Banker Strain: Not on [employer] Insecurity: Not on file  Transportation Needs: Not on file  Physical Activity: Not on file  Stress: Not on file  Social Connections: Not on file  Intimate Partner Violence: Not on file      I personally spent a total of 25 minutes in the care of the patient today including preparing to see the patient, performing a medically appropriate exam/evaluation, counseling and educating, and documenting clinical information in the EHR. This is our first time meeting and time has been spent reviewing past medical history and relevant medical records.   Harlene Bogaert, AGNP-BC  [facility] [address] [place], [place] [address]  Phone [phone] Fax [phone] Note: This document was prepared with digital dictation and possible smart phrase technology. Any transcriptional errors that result from this process are unintentional.

## 2024-08-21 NOTE — Patient Instructions (Addendum)
 Your Plan:  Continue gabapentin  300mg  nightly for painful neuropathy symptoms  If you are interested in doing additional therapy, please let me know  You can try capsaician patches or ointment to help with painful neuropathy symptoms   Use of good orthotics in your shoes can further help when ambulating      Follow up as needed at this time      Thank you for coming to see us  at [facility]. I hope we have been able to provide you high quality care today.  You may receive a patient satisfaction survey over the next few weeks. We would appreciate your feedback and comments so that we may continue to improve ourselves and the health of our patients.

## 2024-10-25 ENCOUNTER — Ambulatory Visit: Admitting: Podiatry

## 2024-10-25 DIAGNOSIS — M79674 Pain in right toe(s): Secondary | ICD-10-CM | POA: Diagnosis not present

## 2024-10-25 DIAGNOSIS — E1149 Type 2 diabetes mellitus with other diabetic neurological complication: Secondary | ICD-10-CM

## 2024-10-25 DIAGNOSIS — Q828 Other specified congenital malformations of skin: Secondary | ICD-10-CM

## 2024-10-25 DIAGNOSIS — B351 Tinea unguium: Secondary | ICD-10-CM | POA: Diagnosis not present

## 2024-10-25 DIAGNOSIS — M79675 Pain in left toe(s): Secondary | ICD-10-CM

## 2024-10-25 NOTE — Progress Notes (Signed)
 Subjective: Chief Complaint  Patient presents with   [facility]    NIDDM patient with an A1c of 5.8 presents today for [facility] and nail trim reports tingling and numbness using gabapetin for symptom relief.     84 y.o. returns the office today for thick, discolored toenails that he is unable to himself as well as for callus to the feet.  He has a thick callus on the bottom of his left foot which causes discomfort at times.  Thinks has not gotten bigger.  Does not report any open lesions.  No other concerns today.   States that his shoes are fitting well.  He is on gabapentin  for neuropathy.  He follows with neurology   [name] Millman, MD- last seen 04/10/2024   Objective: AAO 3, NAD DP/PT pulses palpable, CRT less than 3 seconds Sensation decreased with Semmes Weinstein monofilament. Nails hypertrophic, dystrophic, elongated, brittle, discolored 10. There is tenderness overlying the nails 1-5 bilaterally. There is no surrounding erythema or drainage along the nail sites. Hyperkeratotic lesions noted submetatarsal 4 on the left side as well as right submetatarsal 1st without any underlying ulceration drainage or signs of infection. Prominence of metatarsal heads plantarly after fat pad.  MMT 5/5. No lacerations present. No pain with calf compression, swelling, warmth, erythema.   Assessment: Patient presents with symptomatic onychomycosis; hyperkeratotic lesions; neuropathy  Plan: -Treatment options including alternatives, risks, complications were discussed -Nails sharply debrided 10 without complication/bleeding.  Continue topical medication he is using. -Hyperkeratotic lesion sharply debrided x 2 without any complications or bleeding.  There is a preulcerative particularly left sign use monitoring signs or symptoms flexion and dorsum breakdown. -Gabapentin  for neuropathy. -Discussed daily foot inspection. If there are any changes, to call the office immediately.   Return in about 3  months (around 01/23/2025) for nail trim/pre-ulcerative callus.  [name], DPM

## 2025-01-24 ENCOUNTER — Ambulatory Visit: Admitting: Podiatry
